# Patient Record
Sex: Female | Born: 1960 | Hispanic: No | Marital: Married
Health system: Southern US, Community
[De-identification: ages and names within clinical notes are randomized; demographics above are authoritative.]

---

## 1999-01-18 ENCOUNTER — Encounter: Payer: Self-pay | Admitting: Emergency Medicine

## 1999-01-18 ENCOUNTER — Emergency Department (HOSPITAL_COMMUNITY): Admission: EM | Admit: 1999-01-18 | Discharge: 1999-01-18 | Payer: Self-pay | Admitting: Emergency Medicine

## 1999-07-26 ENCOUNTER — Encounter: Payer: Self-pay | Admitting: Emergency Medicine

## 1999-07-26 ENCOUNTER — Emergency Department (HOSPITAL_COMMUNITY): Admission: EM | Admit: 1999-07-26 | Discharge: 1999-07-26 | Payer: Self-pay | Admitting: Emergency Medicine

## 2001-04-24 ENCOUNTER — Encounter: Admission: RE | Admit: 2001-04-24 | Discharge: 2001-04-24 | Payer: Self-pay | Admitting: Family Medicine

## 2001-05-05 ENCOUNTER — Encounter (INDEPENDENT_AMBULATORY_CARE_PROVIDER_SITE_OTHER): Payer: Self-pay | Admitting: *Deleted

## 2001-05-05 LAB — CONVERTED CEMR LAB

## 2001-05-24 ENCOUNTER — Encounter: Admission: RE | Admit: 2001-05-24 | Discharge: 2001-05-24 | Payer: Self-pay | Admitting: Family Medicine

## 2001-05-24 ENCOUNTER — Other Ambulatory Visit: Admission: RE | Admit: 2001-05-24 | Discharge: 2001-05-24 | Payer: Self-pay | Admitting: Family Medicine

## 2001-05-25 ENCOUNTER — Encounter: Admission: RE | Admit: 2001-05-25 | Discharge: 2001-08-23 | Payer: Self-pay | Admitting: Family Medicine

## 2001-06-07 ENCOUNTER — Ambulatory Visit (HOSPITAL_COMMUNITY): Admission: RE | Admit: 2001-06-07 | Discharge: 2001-06-07 | Payer: Self-pay | Admitting: Family Medicine

## 2001-06-08 ENCOUNTER — Encounter: Payer: Self-pay | Admitting: *Deleted

## 2001-06-08 ENCOUNTER — Ambulatory Visit (HOSPITAL_COMMUNITY): Admission: RE | Admit: 2001-06-08 | Discharge: 2001-06-08 | Payer: Self-pay | Admitting: *Deleted

## 2001-06-09 ENCOUNTER — Encounter (INDEPENDENT_AMBULATORY_CARE_PROVIDER_SITE_OTHER): Payer: Self-pay

## 2001-06-09 ENCOUNTER — Ambulatory Visit (HOSPITAL_COMMUNITY): Admission: RE | Admit: 2001-06-09 | Discharge: 2001-06-09 | Payer: Self-pay | Admitting: Obstetrics and Gynecology

## 2001-06-14 ENCOUNTER — Encounter: Admission: RE | Admit: 2001-06-14 | Discharge: 2001-06-14 | Payer: Self-pay | Admitting: Family Medicine

## 2001-09-21 ENCOUNTER — Encounter: Admission: RE | Admit: 2001-09-21 | Discharge: 2001-09-21 | Payer: Self-pay | Admitting: Family Medicine

## 2001-09-24 ENCOUNTER — Ambulatory Visit (HOSPITAL_COMMUNITY): Admission: RE | Admit: 2001-09-24 | Discharge: 2001-09-24 | Payer: Self-pay | Admitting: Family Medicine

## 2002-05-26 ENCOUNTER — Inpatient Hospital Stay (HOSPITAL_COMMUNITY): Admission: AD | Admit: 2002-05-26 | Discharge: 2002-05-26 | Payer: Self-pay | Admitting: *Deleted

## 2002-06-26 ENCOUNTER — Encounter: Admission: RE | Admit: 2002-06-26 | Discharge: 2002-06-26 | Payer: Self-pay | Admitting: *Deleted

## 2002-07-02 ENCOUNTER — Encounter: Admission: RE | Admit: 2002-07-02 | Discharge: 2002-09-30 | Payer: Self-pay | Admitting: *Deleted

## 2002-07-04 ENCOUNTER — Encounter: Admission: RE | Admit: 2002-07-04 | Discharge: 2002-07-04 | Payer: Self-pay | Admitting: *Deleted

## 2002-07-08 ENCOUNTER — Encounter: Admission: RE | Admit: 2002-07-08 | Discharge: 2002-07-08 | Payer: Self-pay | Admitting: *Deleted

## 2002-07-11 ENCOUNTER — Encounter: Admission: RE | Admit: 2002-07-11 | Discharge: 2002-07-11 | Payer: Self-pay | Admitting: Family Medicine

## 2002-07-18 ENCOUNTER — Encounter: Admission: RE | Admit: 2002-07-18 | Discharge: 2002-07-18 | Payer: Self-pay | Admitting: *Deleted

## 2002-08-15 ENCOUNTER — Encounter: Admission: RE | Admit: 2002-08-15 | Discharge: 2002-08-15 | Payer: Self-pay | Admitting: *Deleted

## 2002-09-05 ENCOUNTER — Encounter: Admission: RE | Admit: 2002-09-05 | Discharge: 2002-09-05 | Payer: Self-pay | Admitting: Family Medicine

## 2002-09-09 ENCOUNTER — Emergency Department (HOSPITAL_COMMUNITY): Admission: EM | Admit: 2002-09-09 | Discharge: 2002-09-09 | Payer: Self-pay | Admitting: Emergency Medicine

## 2002-09-10 ENCOUNTER — Ambulatory Visit (HOSPITAL_COMMUNITY): Admission: RE | Admit: 2002-09-10 | Discharge: 2002-09-10 | Payer: Self-pay | Admitting: Family Medicine

## 2002-09-25 ENCOUNTER — Encounter: Admission: RE | Admit: 2002-09-25 | Discharge: 2002-09-25 | Payer: Self-pay | Admitting: *Deleted

## 2002-10-09 ENCOUNTER — Encounter: Admission: RE | Admit: 2002-10-09 | Discharge: 2002-10-09 | Payer: Self-pay | Admitting: *Deleted

## 2002-10-23 ENCOUNTER — Encounter: Admission: RE | Admit: 2002-10-23 | Discharge: 2002-10-23 | Payer: Self-pay | Admitting: *Deleted

## 2002-11-06 ENCOUNTER — Ambulatory Visit (HOSPITAL_COMMUNITY): Admission: RE | Admit: 2002-11-06 | Discharge: 2002-11-06 | Payer: Self-pay | Admitting: *Deleted

## 2002-11-07 ENCOUNTER — Encounter: Admission: RE | Admit: 2002-11-07 | Discharge: 2002-11-07 | Payer: Self-pay | Admitting: *Deleted

## 2002-11-14 ENCOUNTER — Encounter: Admission: RE | Admit: 2002-11-14 | Discharge: 2002-11-14 | Payer: Self-pay | Admitting: *Deleted

## 2002-11-18 ENCOUNTER — Encounter: Admission: RE | Admit: 2002-11-18 | Discharge: 2002-11-18 | Payer: Self-pay | Admitting: *Deleted

## 2002-11-21 ENCOUNTER — Encounter: Admission: RE | Admit: 2002-11-21 | Discharge: 2002-11-21 | Payer: Self-pay | Admitting: Family Medicine

## 2002-11-28 ENCOUNTER — Encounter: Admission: RE | Admit: 2002-11-28 | Discharge: 2002-11-28 | Payer: Self-pay | Admitting: *Deleted

## 2002-12-02 ENCOUNTER — Encounter: Admission: RE | Admit: 2002-12-02 | Discharge: 2002-12-02 | Payer: Self-pay | Admitting: *Deleted

## 2002-12-10 ENCOUNTER — Encounter: Admission: RE | Admit: 2002-12-10 | Discharge: 2002-12-10 | Payer: Self-pay | Admitting: *Deleted

## 2002-12-12 ENCOUNTER — Encounter: Admission: RE | Admit: 2002-12-12 | Discharge: 2002-12-12 | Payer: Self-pay | Admitting: Family Medicine

## 2002-12-16 ENCOUNTER — Inpatient Hospital Stay (HOSPITAL_COMMUNITY): Admission: AD | Admit: 2002-12-16 | Discharge: 2002-12-16 | Payer: Self-pay | Admitting: Obstetrics & Gynecology

## 2002-12-17 ENCOUNTER — Inpatient Hospital Stay (HOSPITAL_COMMUNITY): Admission: AD | Admit: 2002-12-17 | Discharge: 2002-12-17 | Payer: Self-pay | Admitting: *Deleted

## 2002-12-23 ENCOUNTER — Ambulatory Visit (HOSPITAL_COMMUNITY): Admission: RE | Admit: 2002-12-23 | Discharge: 2002-12-23 | Payer: Self-pay | Admitting: Family Medicine

## 2002-12-23 ENCOUNTER — Encounter: Admission: RE | Admit: 2002-12-23 | Discharge: 2002-12-23 | Payer: Self-pay | Admitting: *Deleted

## 2002-12-24 ENCOUNTER — Inpatient Hospital Stay (HOSPITAL_COMMUNITY): Admission: AD | Admit: 2002-12-24 | Discharge: 2002-12-24 | Payer: Self-pay | Admitting: Obstetrics and Gynecology

## 2002-12-24 ENCOUNTER — Encounter (INDEPENDENT_AMBULATORY_CARE_PROVIDER_SITE_OTHER): Payer: Self-pay | Admitting: Specialist

## 2002-12-24 ENCOUNTER — Inpatient Hospital Stay (HOSPITAL_COMMUNITY): Admission: AD | Admit: 2002-12-24 | Discharge: 2002-12-27 | Payer: Self-pay | Admitting: Obstetrics and Gynecology

## 2003-01-01 ENCOUNTER — Inpatient Hospital Stay (HOSPITAL_COMMUNITY): Admission: AD | Admit: 2003-01-01 | Discharge: 2003-01-01 | Payer: Self-pay | Admitting: Obstetrics and Gynecology

## 2003-01-03 ENCOUNTER — Inpatient Hospital Stay (HOSPITAL_COMMUNITY): Admission: AD | Admit: 2003-01-03 | Discharge: 2003-01-03 | Payer: Self-pay | Admitting: Family Medicine

## 2003-01-08 ENCOUNTER — Inpatient Hospital Stay (HOSPITAL_COMMUNITY): Admission: AD | Admit: 2003-01-08 | Discharge: 2003-01-08 | Payer: Self-pay | Admitting: Obstetrics and Gynecology

## 2003-01-15 ENCOUNTER — Inpatient Hospital Stay (HOSPITAL_COMMUNITY): Admission: AD | Admit: 2003-01-15 | Discharge: 2003-01-15 | Payer: Self-pay | Admitting: Obstetrics and Gynecology

## 2003-01-27 ENCOUNTER — Encounter: Admission: RE | Admit: 2003-01-27 | Discharge: 2003-01-27 | Payer: Self-pay | Admitting: Family Medicine

## 2003-01-29 ENCOUNTER — Encounter: Admission: RE | Admit: 2003-01-29 | Discharge: 2003-01-29 | Payer: Self-pay | Admitting: Family Medicine

## 2003-02-04 ENCOUNTER — Emergency Department (HOSPITAL_COMMUNITY): Admission: EM | Admit: 2003-02-04 | Discharge: 2003-02-04 | Payer: Self-pay | Admitting: Emergency Medicine

## 2003-02-12 ENCOUNTER — Encounter: Admission: RE | Admit: 2003-02-12 | Discharge: 2003-02-12 | Payer: Self-pay | Admitting: Family Medicine

## 2003-07-09 ENCOUNTER — Encounter: Admission: RE | Admit: 2003-07-09 | Discharge: 2003-07-09 | Payer: Self-pay | Admitting: Specialist

## 2004-01-09 ENCOUNTER — Ambulatory Visit: Payer: Self-pay | Admitting: *Deleted

## 2004-01-09 ENCOUNTER — Ambulatory Visit: Payer: Self-pay | Admitting: Internal Medicine

## 2004-03-08 ENCOUNTER — Ambulatory Visit: Payer: Self-pay | Admitting: Internal Medicine

## 2004-03-24 ENCOUNTER — Emergency Department (HOSPITAL_COMMUNITY): Admission: EM | Admit: 2004-03-24 | Discharge: 2004-03-24 | Payer: Self-pay | Admitting: Emergency Medicine

## 2004-07-05 ENCOUNTER — Ambulatory Visit: Payer: Self-pay | Admitting: Internal Medicine

## 2004-07-27 ENCOUNTER — Ambulatory Visit: Payer: Self-pay | Admitting: Internal Medicine

## 2004-08-03 ENCOUNTER — Ambulatory Visit: Payer: Self-pay | Admitting: Internal Medicine

## 2004-10-14 IMAGING — CT CT ABDOMEN W/O CM
1 series · 15 of 32 positions shown, 19 images · non-contrast
Comparison: none

CLINICAL DATA: Abdominal and pelvic pain.  Left flank pain. 
CT SCAN OF THE ABDOMEN AND PELVIS WITHOUT CONTRAST
TECHNIQUE: Multidetector helical CT scanning obtained from the upper abdomen through the remainder of the abdomen and pelvis.

[Series 2: renal stone · axial · 0.70mm/px · z∈[-405,-80]mm · 15 of 73 slices shown, 19 images]
[im 5/73  soft-tissue]
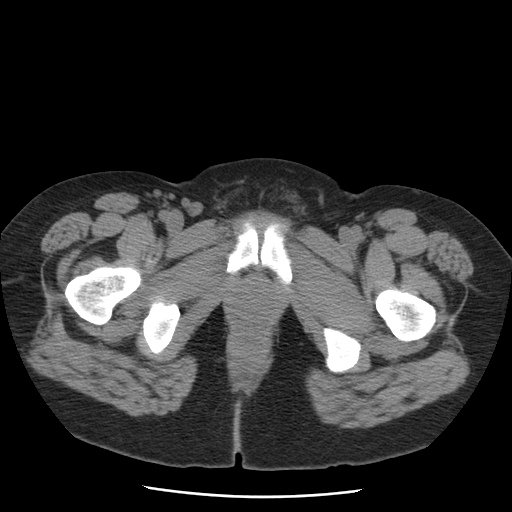
[im 5/73  bone]
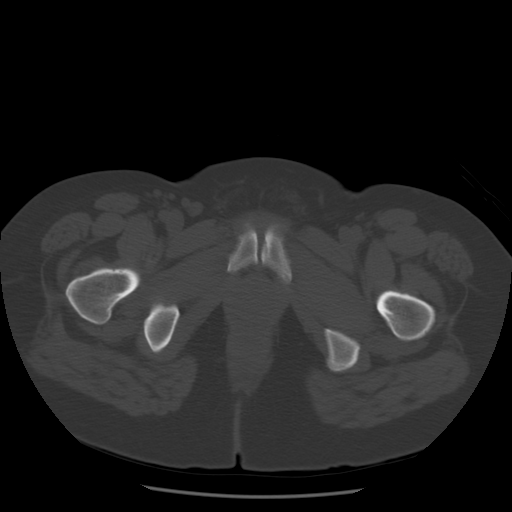
[im 10/73  soft-tissue]
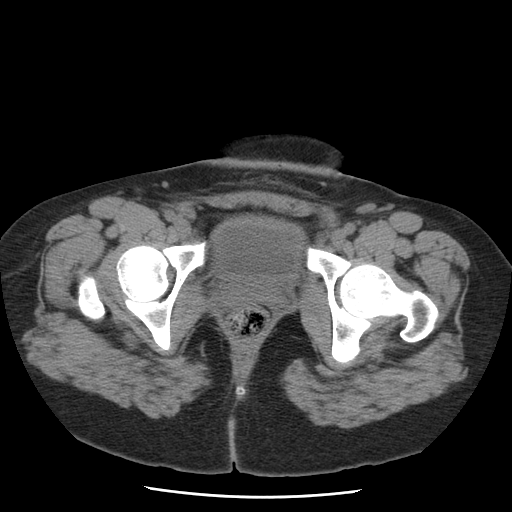
[im 14/73  soft-tissue]
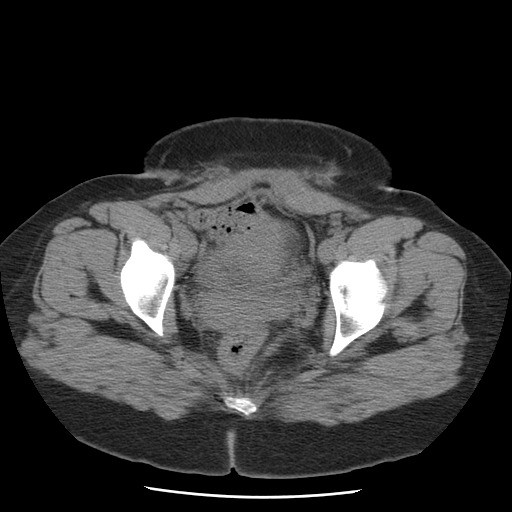
[im 21/73  soft-tissue]
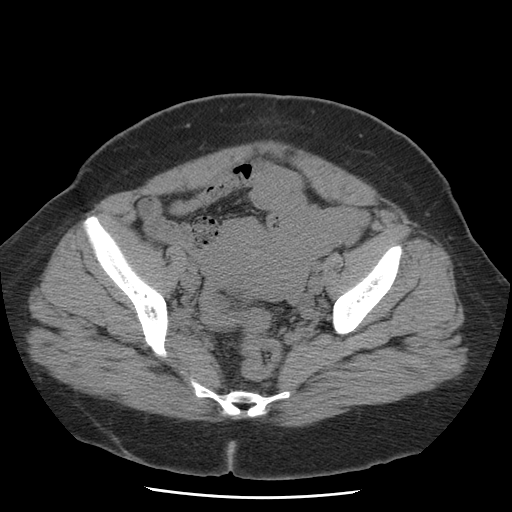
[im 26/73  soft-tissue]
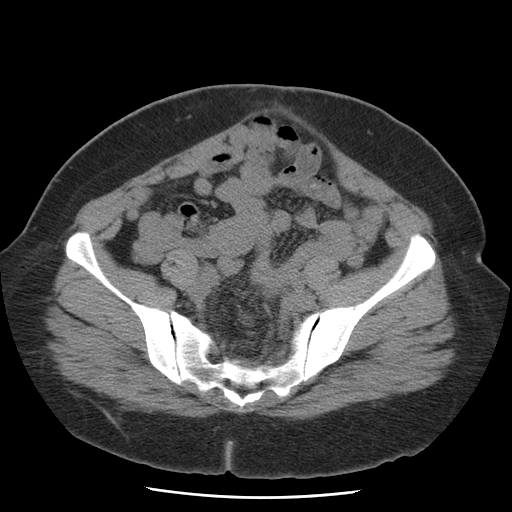
[im 31/73  soft-tissue]
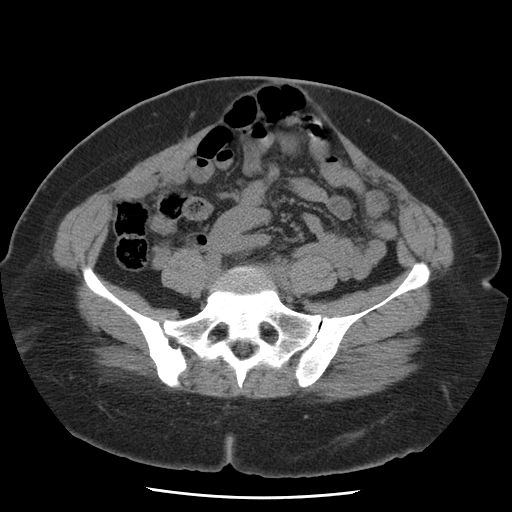
[im 38/73  soft-tissue]
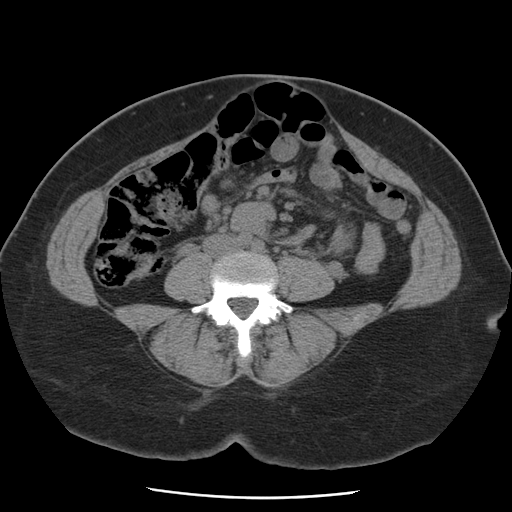
[im 42/73  soft-tissue]
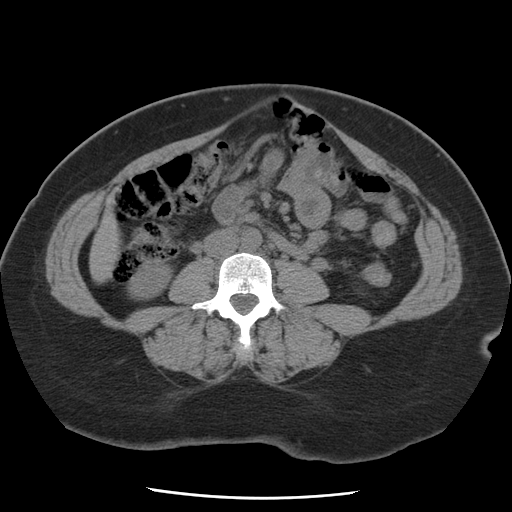
[im 47/73  soft-tissue]
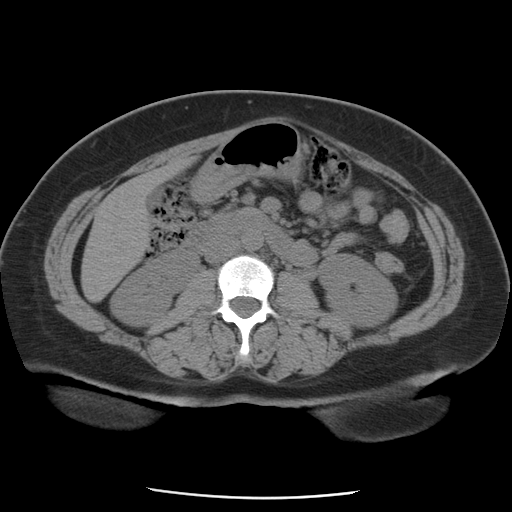
[im 47/73  bone]
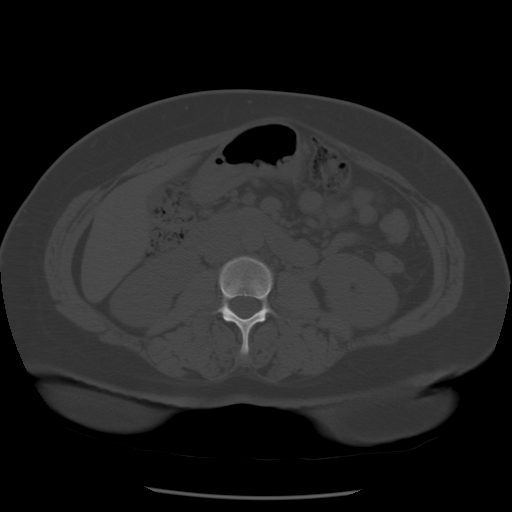
[im 52/73  soft-tissue]
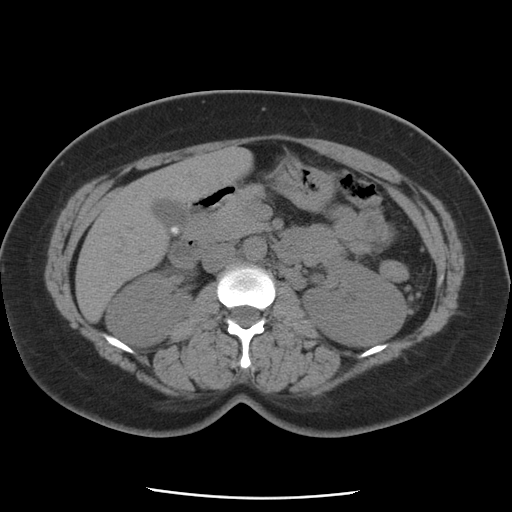
[im 59/73  soft-tissue]
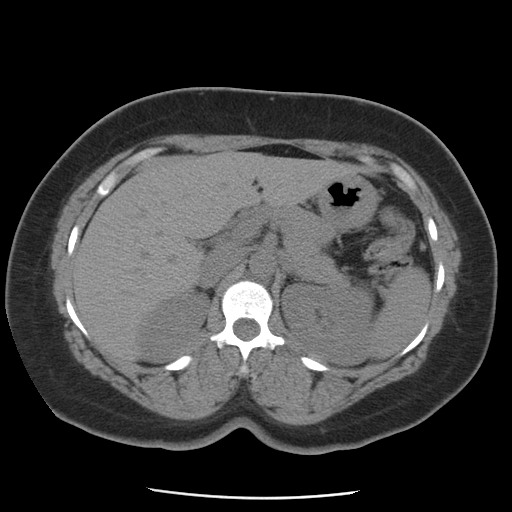
[im 63/73  soft-tissue]
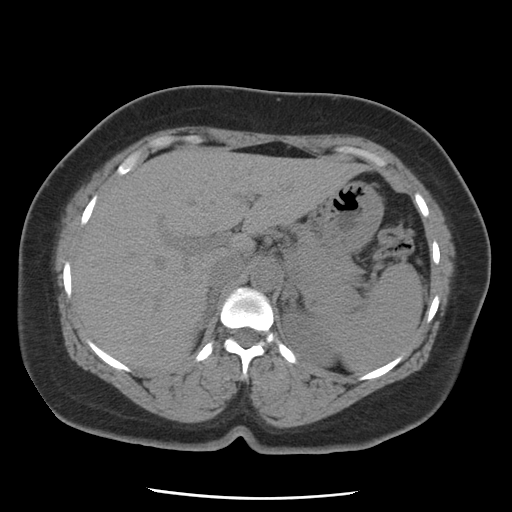
[im 63/73  lung]
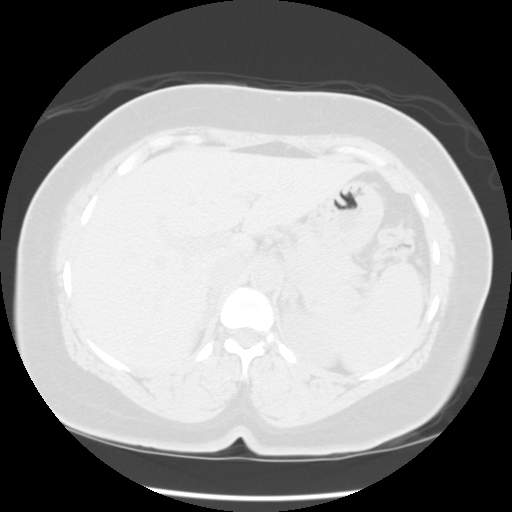
[im 66/73  lung]
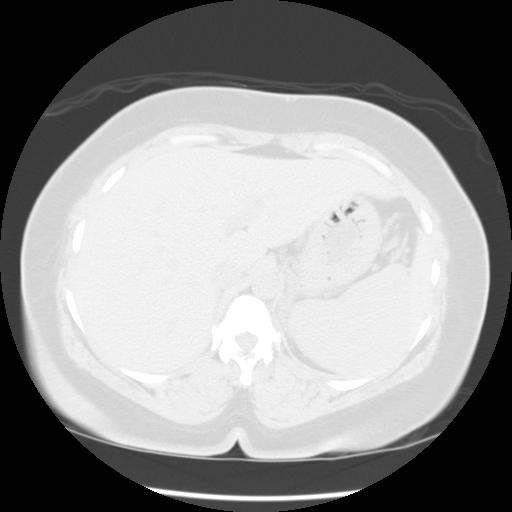
[im 68/73  soft-tissue]
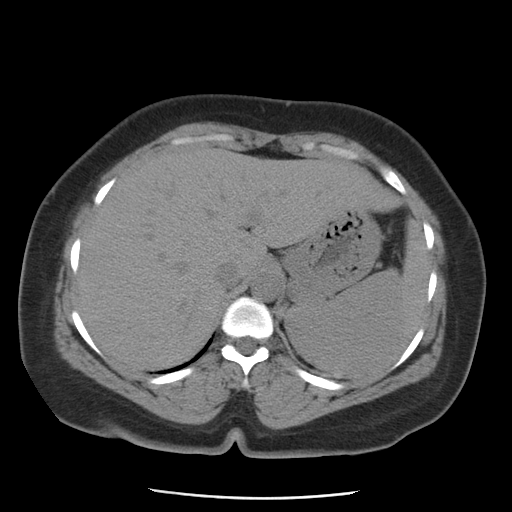
[im 68/73  lung]
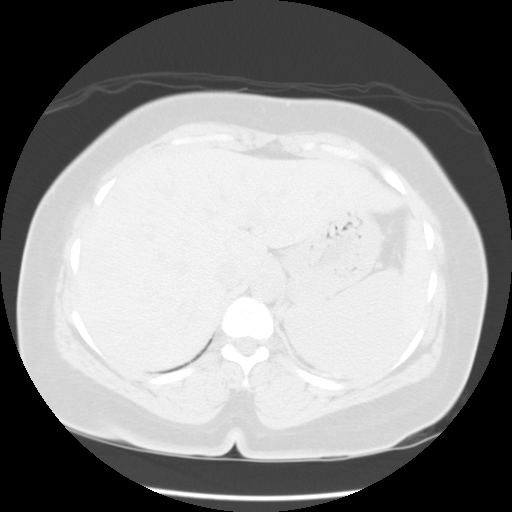
[im 70/73  lung]
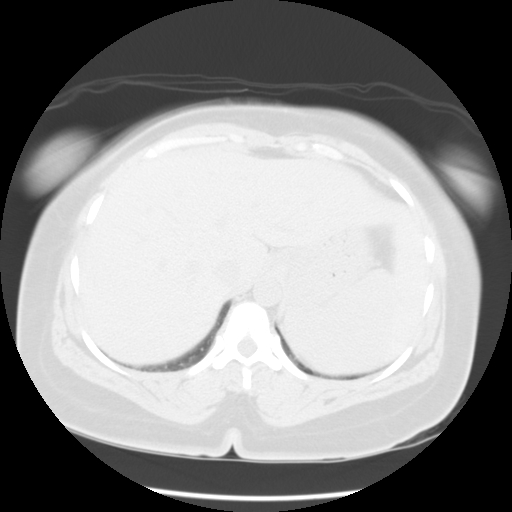

[15 of 32 positions shown; findings below may reference images not displayed]

FINDINGS: The visualized liver and spleen are unremarkable.  The adrenal glands, pancreas, and kidneys are unremarkable.  No evidence of urinary calculi or hydronephrosis.  Please note that parenchymal lesions within the solid viscera may be missed as intravenous contrast was not administered.  6 mm gallstone within the gallbladder is noted, with the remainder of the gallbladder unremarkable.  No evidence of enlarged lymph nodes or free fluid.  No abdominal aortic aneurysm.  Please note that parenchymal lesions within the solid viscera may be missed as intravenous contrast was not administered.
IMPRESSION
No acute abnormality
Cholelithiasis

CT SCAN OF THE PELVIS
Mild eventration of the anterior abdominal musculature at the midline is noted.  No free fluid, enlarged lymph nodes, or masses.  There is mild stranding in the midline anterior abdominal musculature compatible with recent C-section.  The visualized bowel is grossly unremarkable.  No evidence of urinary calculi. 
IMPRESSION
No acute abnormality.  Evidence of recent C-section.

## 2005-02-16 ENCOUNTER — Ambulatory Visit: Payer: Self-pay | Admitting: Family Medicine

## 2005-03-25 ENCOUNTER — Ambulatory Visit: Payer: Self-pay | Admitting: Internal Medicine

## 2005-04-25 ENCOUNTER — Ambulatory Visit: Payer: Self-pay | Admitting: Family Medicine

## 2005-05-23 ENCOUNTER — Ambulatory Visit: Payer: Self-pay | Admitting: Family Medicine

## 2005-06-14 ENCOUNTER — Ambulatory Visit: Payer: Self-pay | Admitting: Family Medicine

## 2005-08-04 ENCOUNTER — Ambulatory Visit: Payer: Self-pay | Admitting: Internal Medicine

## 2005-09-20 ENCOUNTER — Ambulatory Visit: Payer: Self-pay | Admitting: Family Medicine
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
## 2005-12-26 ENCOUNTER — Ambulatory Visit: Payer: Self-pay | Admitting: Internal Medicine

## 2005-12-27 LAB — CONVERTED CEMR LAB
Cholesterol: 132 mg/dL
HDL: 36 mg/dL
LDL Cholesterol: 81 mg/dL
Total CHOL/HDL Ratio: 3.7
Triglycerides: 76 mg/dL
VLDL: 15 mg/dL

## 2006-03-29 ENCOUNTER — Ambulatory Visit: Payer: Self-pay | Admitting: Internal Medicine

## 2006-04-20 ENCOUNTER — Emergency Department (HOSPITAL_COMMUNITY): Admission: EM | Admit: 2006-04-20 | Discharge: 2006-04-20 | Payer: Self-pay | Admitting: Pediatrics

## 2006-06-01 DIAGNOSIS — M654 Radial styloid tenosynovitis [de Quervain]: Secondary | ICD-10-CM

## 2006-06-01 DIAGNOSIS — E119 Type 2 diabetes mellitus without complications: Secondary | ICD-10-CM | POA: Insufficient documentation

## 2006-06-02 ENCOUNTER — Encounter (INDEPENDENT_AMBULATORY_CARE_PROVIDER_SITE_OTHER): Payer: Self-pay | Admitting: *Deleted

## 2006-07-03 ENCOUNTER — Ambulatory Visit: Payer: Self-pay | Admitting: Internal Medicine

## 2006-09-08 ENCOUNTER — Ambulatory Visit: Payer: Self-pay | Admitting: Internal Medicine

## 2006-09-19 ENCOUNTER — Ambulatory Visit: Payer: Self-pay | Admitting: Family Medicine

## 2006-09-21 ENCOUNTER — Encounter: Admission: RE | Admit: 2006-09-21 | Discharge: 2006-12-20 | Payer: Self-pay | Admitting: Internal Medicine

## 2006-09-21 ENCOUNTER — Ambulatory Visit (HOSPITAL_COMMUNITY): Admission: RE | Admit: 2006-09-21 | Discharge: 2006-09-21 | Payer: Self-pay | Admitting: Internal Medicine

## 2006-11-07 ENCOUNTER — Ambulatory Visit: Payer: Self-pay | Admitting: Internal Medicine

## 2006-12-20 ENCOUNTER — Encounter (INDEPENDENT_AMBULATORY_CARE_PROVIDER_SITE_OTHER): Payer: Self-pay | Admitting: *Deleted

## 2007-01-04 ENCOUNTER — Encounter (INDEPENDENT_AMBULATORY_CARE_PROVIDER_SITE_OTHER): Payer: Self-pay | Admitting: Internal Medicine

## 2007-01-16 ENCOUNTER — Ambulatory Visit: Payer: Self-pay | Admitting: Nurse Practitioner

## 2007-01-16 DIAGNOSIS — B379 Candidiasis, unspecified: Secondary | ICD-10-CM | POA: Insufficient documentation

## 2007-01-16 DIAGNOSIS — E785 Hyperlipidemia, unspecified: Secondary | ICD-10-CM

## 2007-01-16 LAB — CONVERTED CEMR LAB
Blood Glucose, Fingerstick: 107
Cholesterol, target level: 200 mg/dL
HDL goal, serum: 40 mg/dL
Hgb A1c MFr Bld: 6.1 %
LDL Goal: 100 mg/dL

## 2007-01-22 ENCOUNTER — Telehealth (INDEPENDENT_AMBULATORY_CARE_PROVIDER_SITE_OTHER): Payer: Self-pay | Admitting: *Deleted

## 2007-01-27 ENCOUNTER — Emergency Department (HOSPITAL_COMMUNITY): Admission: EM | Admit: 2007-01-27 | Discharge: 2007-01-27 | Payer: Self-pay | Admitting: Family Medicine

## 2007-02-01 ENCOUNTER — Encounter (INDEPENDENT_AMBULATORY_CARE_PROVIDER_SITE_OTHER): Payer: Self-pay | Admitting: Nurse Practitioner

## 2007-02-01 ENCOUNTER — Ambulatory Visit: Payer: Self-pay | Admitting: Family Medicine

## 2007-02-01 LAB — CONVERTED CEMR LAB
Blood Glucose, AC Bkfst: 104 mg/dL
Blood Glucose, Fingerstick: 104
Cholesterol: 130 mg/dL
HDL: 41 mg/dL
LDL Cholesterol: 75 mg/dL
Total CHOL/HDL Ratio: 3.2
Triglycerides: 72 mg/dL
VLDL: 14 mg/dL

## 2007-02-02 ENCOUNTER — Encounter (INDEPENDENT_AMBULATORY_CARE_PROVIDER_SITE_OTHER): Payer: Self-pay | Admitting: Nurse Practitioner

## 2007-03-09 ENCOUNTER — Telehealth (INDEPENDENT_AMBULATORY_CARE_PROVIDER_SITE_OTHER): Payer: Self-pay | Admitting: *Deleted

## 2007-03-09 ENCOUNTER — Emergency Department (HOSPITAL_COMMUNITY): Admission: EM | Admit: 2007-03-09 | Discharge: 2007-03-09 | Payer: Self-pay | Admitting: Family Medicine

## 2007-03-21 ENCOUNTER — Ambulatory Visit: Payer: Self-pay | Admitting: Family Medicine

## 2007-03-21 DIAGNOSIS — N644 Mastodynia: Secondary | ICD-10-CM

## 2007-03-21 LAB — CONVERTED CEMR LAB: Blood Glucose, Fingerstick: 134

## 2007-03-22 ENCOUNTER — Ambulatory Visit (HOSPITAL_COMMUNITY): Admission: RE | Admit: 2007-03-22 | Discharge: 2007-03-22 | Payer: Self-pay | Admitting: Family Medicine

## 2007-04-03 ENCOUNTER — Telehealth (INDEPENDENT_AMBULATORY_CARE_PROVIDER_SITE_OTHER): Payer: Self-pay | Admitting: Family Medicine

## 2007-05-29 ENCOUNTER — Ambulatory Visit: Payer: Self-pay | Admitting: Family Medicine

## 2007-05-29 DIAGNOSIS — H6120 Impacted cerumen, unspecified ear: Secondary | ICD-10-CM

## 2007-05-29 DIAGNOSIS — L708 Other acne: Secondary | ICD-10-CM

## 2007-05-29 DIAGNOSIS — K219 Gastro-esophageal reflux disease without esophagitis: Secondary | ICD-10-CM

## 2007-05-29 LAB — CONVERTED CEMR LAB
Blood Glucose, Fingerstick: 114
Hgb A1c MFr Bld: 6.4 %

## 2007-09-15 ENCOUNTER — Emergency Department (HOSPITAL_COMMUNITY): Admission: EM | Admit: 2007-09-15 | Discharge: 2007-09-15 | Payer: Self-pay | Admitting: Emergency Medicine

## 2008-01-30 ENCOUNTER — Emergency Department (HOSPITAL_COMMUNITY): Admission: EM | Admit: 2008-01-30 | Discharge: 2008-01-30 | Payer: Self-pay | Admitting: Family Medicine

## 2008-03-24 ENCOUNTER — Ambulatory Visit (HOSPITAL_COMMUNITY): Admission: RE | Admit: 2008-03-24 | Discharge: 2008-03-24 | Payer: Self-pay | Admitting: Family Medicine

## 2008-06-03 ENCOUNTER — Ambulatory Visit: Payer: Self-pay | Admitting: Family Medicine

## 2008-06-03 DIAGNOSIS — I10 Essential (primary) hypertension: Secondary | ICD-10-CM | POA: Insufficient documentation

## 2008-06-03 LAB — CONVERTED CEMR LAB
Bilirubin Urine: NEGATIVE
Blood Glucose, Fingerstick: 99
Blood in Urine, dipstick: NEGATIVE
Glucose, Urine, Semiquant: NEGATIVE
Hgb A1c MFr Bld: 6.5 %
Ketones, urine, test strip: NEGATIVE
Microalb, Ur: 0.77 mg/dL (ref 0.00–1.89)
Nitrite: NEGATIVE
Protein, U semiquant: NEGATIVE
Specific Gravity, Urine: >=1.03
Urobilinogen, UA: 1
WBC Urine, dipstick: NEGATIVE
pH: 5.5

## 2008-06-04 ENCOUNTER — Telehealth (INDEPENDENT_AMBULATORY_CARE_PROVIDER_SITE_OTHER): Payer: Self-pay | Admitting: Family Medicine

## 2008-06-26 ENCOUNTER — Ambulatory Visit: Payer: Self-pay | Admitting: Family Medicine

## 2008-06-30 ENCOUNTER — Ambulatory Visit: Payer: Self-pay | Admitting: Family Medicine

## 2008-06-30 LAB — CONVERTED CEMR LAB
ALT: 14 units/L (ref 0–35)
AST: 12 units/L (ref 0–37)
Albumin: 4.2 g/dL (ref 3.5–5.2)
Alkaline Phosphatase: 76 units/L (ref 39–117)
BUN: 10 mg/dL (ref 6–23)
Basophils Absolute: 0 10*3/uL (ref 0.0–0.1)
Basophils Relative: 0 % (ref 0–1)
CO2: 22 meq/L (ref 19–32)
Calcium: 8.8 mg/dL (ref 8.4–10.5)
Chloride: 105 meq/L (ref 96–112)
Cholesterol: 136 mg/dL (ref 0–200)
Creatinine, Ser: 0.5 mg/dL (ref 0.40–1.20)
Eosinophils Absolute: 0.1 10*3/uL (ref 0.0–0.7)
Eosinophils Relative: 2 % (ref 0–5)
Glucose, Bld: 111 mg/dL — ABNORMAL HIGH (ref 70–99)
HCT: 35.4 % — ABNORMAL LOW (ref 36.0–46.0)
HDL: 37 mg/dL — ABNORMAL LOW (ref >39)
Hemoglobin: 12 g/dL (ref 12.0–15.0)
LDL Cholesterol: 82 mg/dL (ref 0–99)
Lymphocytes Relative: 42 % (ref 12–46)
Lymphs Abs: 2.1 10*3/uL (ref 0.7–4.0)
MCHC: 33.9 g/dL (ref 30.0–36.0)
MCV: 87 fL (ref 78.0–100.0)
Monocytes Absolute: 0.4 10*3/uL (ref 0.1–1.0)
Monocytes Relative: 8 % (ref 3–12)
Neutro Abs: 2.3 10*3/uL (ref 1.7–7.7)
Neutrophils Relative %: 47 % (ref 43–77)
Platelets: 241 10*3/uL (ref 150–400)
Potassium: 4.2 meq/L (ref 3.5–5.3)
RBC: 4.07 M/uL (ref 3.87–5.11)
RDW: 13.2 % (ref 11.5–15.5)
Sodium: 139 meq/L (ref 135–145)
TSH: 0.522 microintl units/mL (ref 0.350–4.500)
Total Bilirubin: 0.3 mg/dL (ref 0.3–1.2)
Total CHOL/HDL Ratio: 3.7
Total Protein: 7.1 g/dL (ref 6.0–8.3)
Triglycerides: 83 mg/dL (ref <150)
VLDL: 17 mg/dL (ref 0–40)
WBC: 4.9 10*3/uL (ref 4.0–10.5)

## 2008-07-10 ENCOUNTER — Ambulatory Visit: Payer: Self-pay | Admitting: Internal Medicine

## 2008-07-10 ENCOUNTER — Encounter: Payer: Self-pay | Admitting: Internal Medicine

## 2008-07-10 DIAGNOSIS — R42 Dizziness and giddiness: Secondary | ICD-10-CM

## 2008-07-10 DIAGNOSIS — J019 Acute sinusitis, unspecified: Secondary | ICD-10-CM

## 2008-07-10 LAB — CONVERTED CEMR LAB: Blood Glucose, Fingerstick: 166

## 2008-07-18 ENCOUNTER — Ambulatory Visit: Payer: Self-pay | Admitting: Nurse Practitioner

## 2009-03-06 ENCOUNTER — Telehealth: Payer: Self-pay | Admitting: Physician Assistant

## 2009-03-16 ENCOUNTER — Telehealth: Payer: Self-pay | Admitting: Physician Assistant

## 2009-03-25 ENCOUNTER — Encounter: Payer: Self-pay | Admitting: Physician Assistant

## 2009-03-25 ENCOUNTER — Ambulatory Visit (HOSPITAL_COMMUNITY): Admission: RE | Admit: 2009-03-25 | Discharge: 2009-03-25 | Payer: Self-pay | Admitting: Family Medicine

## 2009-04-01 ENCOUNTER — Encounter: Payer: Self-pay | Admitting: Physician Assistant

## 2009-04-01 ENCOUNTER — Ambulatory Visit: Payer: Self-pay | Admitting: Nurse Practitioner

## 2009-04-01 DIAGNOSIS — H547 Unspecified visual loss: Secondary | ICD-10-CM

## 2009-04-01 DIAGNOSIS — K5900 Constipation, unspecified: Secondary | ICD-10-CM | POA: Insufficient documentation

## 2009-04-01 LAB — CONVERTED CEMR LAB
Blood Glucose, Fingerstick: 109
Chlamydia, DNA Probe: NEGATIVE
GC Probe Amp, Genital: NEGATIVE
OCCULT 1: NEGATIVE

## 2009-04-14 ENCOUNTER — Encounter (INDEPENDENT_AMBULATORY_CARE_PROVIDER_SITE_OTHER): Payer: Self-pay | Admitting: Nurse Practitioner

## 2009-04-15 ENCOUNTER — Encounter: Payer: Self-pay | Admitting: Physician Assistant

## 2009-04-21 ENCOUNTER — Ambulatory Visit: Payer: Self-pay | Admitting: Nurse Practitioner

## 2009-04-21 LAB — CONVERTED CEMR LAB
ALT: 16 units/L (ref 0–35)
AST: 11 units/L (ref 0–37)
Albumin: 4.2 g/dL (ref 3.5–5.2)
Alkaline Phosphatase: 82 units/L (ref 39–117)
BUN: 8 mg/dL (ref 6–23)
Basophils Absolute: 0 10*3/uL (ref 0.0–0.1)
Basophils Relative: 0 % (ref 0–1)
CO2: 21 meq/L (ref 19–32)
Calcium: 9 mg/dL (ref 8.4–10.5)
Chloride: 104 meq/L (ref 96–112)
Cholesterol: 164 mg/dL (ref 0–200)
Creatinine, Ser: 0.45 mg/dL (ref 0.40–1.20)
Eosinophils Absolute: 0.1 10*3/uL (ref 0.0–0.7)
Eosinophils Relative: 2 % (ref 0–5)
Glucose, Bld: 137 mg/dL — ABNORMAL HIGH (ref 70–99)
HCT: 36.6 % (ref 36.0–46.0)
HDL: 45 mg/dL (ref >39)
Hemoglobin: 11.8 g/dL — ABNORMAL LOW (ref 12.0–15.0)
LDL Cholesterol: 105 mg/dL — ABNORMAL HIGH (ref 0–99)
Lymphocytes Relative: 40 % (ref 12–46)
Lymphs Abs: 2.1 10*3/uL (ref 0.7–4.0)
MCHC: 32.2 g/dL (ref 30.0–36.0)
MCV: 89.7 fL (ref 78.0–100.0)
Monocytes Absolute: 0.4 10*3/uL (ref 0.1–1.0)
Monocytes Relative: 8 % (ref 3–12)
Neutro Abs: 2.7 10*3/uL (ref 1.7–7.7)
Neutrophils Relative %: 50 % (ref 43–77)
Platelets: 260 10*3/uL (ref 150–400)
Potassium: 3.8 meq/L (ref 3.5–5.3)
RBC: 4.08 M/uL (ref 3.87–5.11)
RDW: 13.2 % (ref 11.5–15.5)
Sodium: 136 meq/L (ref 135–145)
TSH: 0.471 microintl units/mL (ref 0.350–4.500)
Total Bilirubin: 0.3 mg/dL (ref 0.3–1.2)
Total CHOL/HDL Ratio: 3.6
Total Protein: 7 g/dL (ref 6.0–8.3)
Triglycerides: 69 mg/dL (ref <150)
VLDL: 14 mg/dL (ref 0–40)
WBC: 5.4 10*3/uL (ref 4.0–10.5)

## 2009-04-22 ENCOUNTER — Encounter (INDEPENDENT_AMBULATORY_CARE_PROVIDER_SITE_OTHER): Payer: Self-pay | Admitting: Nurse Practitioner

## 2009-04-29 ENCOUNTER — Ambulatory Visit: Payer: Self-pay | Admitting: Nurse Practitioner

## 2009-04-29 DIAGNOSIS — R209 Unspecified disturbances of skin sensation: Secondary | ICD-10-CM

## 2009-04-29 LAB — CONVERTED CEMR LAB: Blood Glucose, Fingerstick: 131

## 2009-06-26 ENCOUNTER — Ambulatory Visit: Payer: Self-pay | Admitting: Nurse Practitioner

## 2009-07-19 ENCOUNTER — Emergency Department (HOSPITAL_COMMUNITY): Admission: EM | Admit: 2009-07-19 | Discharge: 2009-07-19 | Payer: Self-pay | Admitting: Emergency Medicine

## 2009-09-01 ENCOUNTER — Telehealth (INDEPENDENT_AMBULATORY_CARE_PROVIDER_SITE_OTHER): Payer: Self-pay | Admitting: Nurse Practitioner

## 2010-01-07 ENCOUNTER — Emergency Department (HOSPITAL_COMMUNITY): Admission: EM | Admit: 2010-01-07 | Discharge: 2010-01-07 | Payer: Self-pay | Admitting: Emergency Medicine

## 2010-01-15 ENCOUNTER — Ambulatory Visit: Payer: Self-pay | Admitting: Nurse Practitioner

## 2010-01-15 DIAGNOSIS — E669 Obesity, unspecified: Secondary | ICD-10-CM

## 2010-01-15 LAB — CONVERTED CEMR LAB
Basophils Absolute: 0 10*3/uL (ref 0.0–0.1)
Basophils Relative: 0 % (ref 0–1)
Blood Glucose, Fingerstick: 155
Eosinophils Absolute: 0.2 10*3/uL (ref 0.0–0.7)
Eosinophils Relative: 3 % (ref 0–5)
HCT: 36.3 % (ref 36.0–46.0)
Hemoglobin: 11.7 g/dL — ABNORMAL LOW (ref 12.0–15.0)
Hgb A1c MFr Bld: 6.8 % — ABNORMAL HIGH (ref <5.7)
Lymphocytes Relative: 31 % (ref 12–46)
Lymphs Abs: 2.4 10*3/uL (ref 0.7–4.0)
MCHC: 32.2 g/dL (ref 30.0–36.0)
MCV: 88.8 fL (ref 78.0–100.0)
Monocytes Absolute: 0.5 10*3/uL (ref 0.1–1.0)
Monocytes Relative: 7 % (ref 3–12)
Neutro Abs: 4.6 10*3/uL (ref 1.7–7.7)
Neutrophils Relative %: 59 % (ref 43–77)
Platelets: 297 10*3/uL (ref 150–400)
RBC: 4.09 M/uL (ref 3.87–5.11)
RDW: 13.4 % (ref 11.5–15.5)
Rapid Strep: NEGATIVE
WBC: 7.7 10*3/uL (ref 4.0–10.5)

## 2010-01-19 ENCOUNTER — Encounter (INDEPENDENT_AMBULATORY_CARE_PROVIDER_SITE_OTHER): Payer: Self-pay | Admitting: Nurse Practitioner

## 2010-03-10 ENCOUNTER — Telehealth (INDEPENDENT_AMBULATORY_CARE_PROVIDER_SITE_OTHER): Payer: Self-pay | Admitting: Nurse Practitioner

## 2010-03-17 ENCOUNTER — Telehealth (INDEPENDENT_AMBULATORY_CARE_PROVIDER_SITE_OTHER): Payer: Self-pay | Admitting: Nurse Practitioner

## 2010-03-18 ENCOUNTER — Ambulatory Visit (HOSPITAL_COMMUNITY)
Admission: RE | Admit: 2010-03-18 | Discharge: 2010-03-18 | Payer: Self-pay | Source: Home / Self Care | Attending: Internal Medicine | Admitting: Internal Medicine

## 2010-04-12 ENCOUNTER — Ambulatory Visit: Admit: 2010-04-12 | Payer: Self-pay | Admitting: Nurse Practitioner

## 2010-05-02 LAB — CONVERTED CEMR LAB

## 2010-05-06 NOTE — Progress Notes (Signed)
Summary: REFILLS/ GOING HOME FOR THE SUMMER   Phone Note Call from Patient Call back at Home Phone [phone] Call back at [phone] CELL   Reason for Call: Refill Medication Summary of Call: MARTIN PT. MS [place] SAYS THAT SHE IS LEAVING NEXT FRIDAY JUNE 10th AND RETURNING ON SEPTEMBER 9th, AND SHE NEEDS HER MEDICINE, GLUCOTROL AND LISINOPRIL TO TAKE WITH HER, AND THE PHARMACY TOLD HER THAT SHE COULDN'T GET THAT MANY REFILLS ON IT. Initial call taken by: [name],  Sep 01, 2009 4:21 PM  Follow-up for Phone Call        forward to N.Daphine Deutscher, FNP Follow-up by: Levon Hedger,  Sep 01, 2009 4:27 PM  Additional Follow-up for Phone Call Additional follow up Details #1::        Rx printed and in basket pt will need to get 90 supply from [facility]. advise pt there may be a price difference [place] pharmacy will only given 30 days at a time Additional Follow-up by: Lehman Prom FNP,  September 02, 2009 8:36 AM    Additional Follow-up for Phone Call Additional follow up Details #2::    called [phone] left message on machine for pt to return call to the office. Levon Hedger  September 03, 2009 10:50 AM   Additional Follow-up for Phone Call Additional follow up Details #3:: Details for Additional Follow-up Action Taken: Spoke with pt. faxed scripts to [facility] [address] Gaylyn Cheers RN  September 04, 2009 3:11 PM   Prescriptions: LISINOPRIL 5 MG TABS (LISINOPRIL) Take 1 tablet by mouth every morning for blood pressure and kidney protection  #90 x 1   Entered and Authorized by:   Lehman Prom FNP   Signed by:   Lehman Prom FNP on 09/02/2009   Method used:   Print then Give to Patient   RxID:   [id number] GLUCOTROL XL 10 MG  TB24 (GLIPIZIDE) Take 1 tablet by mouth every morning  #90 x 1   Entered and Authorized by:   Lehman Prom FNP   Signed by:   Lehman Prom FNP on 09/02/2009   Method used:   Print then Give to Patient   RxID:   [id number] METFORMIN HCL 500  MG  TABS (METFORMIN HCL) One tablet by mouth two times a day  #180 x 1   Entered and Authorized by:   Lehman Prom FNP   Signed by:   Lehman Prom FNP on 09/02/2009   Method used:   Print then Give to Patient   RxID:   [id number]

## 2010-05-06 NOTE — Letter (Signed)
Summary: *HSN Results Follow up  [facility]  [address] [place], [place] [address]   Phone: [phone]  Fax: [phone]      01/19/2010   [name] [address] [place], [place]  [address]   Dear  Ms. [name],                            ____S.Drinkard,FNP   ____D. Gore,FNP       ____B. McPherson,MD   ____V. Rankins,MD    ____E. Mulberry,MD    __X__N. Daphine Deutscher, FNP  ____D. Reche Dixon, MD    ____K. Philipp Deputy, MD    ____Other     This letter is to inform you that your recent test(s):  _______Pap Smear    ___X____Lab Test     _______X-ray    ___X____ is within acceptable limits  _______ requires a medication change  _______ requires a follow-up lab visit  _______ requires a follow-up visit with your provider   Comments: Labs done during recent office visit are normal.       _________________________________________________________ If you have any questions, please contact our office [phone].                    Sincerely,    Lehman Prom FNP [facility]

## 2010-05-06 NOTE — Miscellaneous (Signed)
Summary: Mammogrm Results   Clinical Lists Changes  Observations: Added new observation of MAMMO DUE: 05/2010 (04/14/2009 13:05) Added new observation of MAMMRECACT: Screening mammogram in 1 year.    (04/01/2009 13:06) Added new observation of MAMMOGRAM: No specific mammographic evidence of malignancy.  Assessment: BIRADS 1.  (04/01/2009 13:06)      Mammogram  Procedure date:  04/01/2009  Findings:      No specific mammographic evidence of malignancy.  Assessment: BIRADS 1.   Comments:      Screening mammogram in 1 year.     Procedures Next Due Date:    Mammogram: 05/2010   Mammogram  Procedure date:  04/01/2009  Findings:      No specific mammographic evidence of malignancy.  Assessment: BIRADS 1.   Comments:      Screening mammogram in 1 year.     Procedures Next Due Date:    Mammogram: 05/2010

## 2010-05-06 NOTE — Letter (Signed)
Summary: *HSN Results Follow up  [facility]  [address] [place], [place] [address]   Phone: [phone]  Fax: [phone]      04/15/2009   [facility] [place] [address] [place], [place]  [address]   Dear  Ms. [name],                            ____S.Drinkard,FNP   ____D. Gore,FNP       ____B. McPherson,MD   ____V. Rankins,MD    ____E. Mulberry,MD    ____N. Daphine Deutscher, FNP  ____D. Reche Dixon, MD    ____K. Philipp Deputy, MD    __x__S. Alben Spittle, PA-C     This letter is to inform you that your recent test(s):  ___x____Pap Smear    _______Lab Test     _______X-ray    ___x____ is within acceptable limits  _______ requires a medication change  _______ requires a follow-up lab visit  _______ requires a follow-up visit with your provider   Comments:       _________________________________________________________ If you have any questions, please contact our office                     Sincerely,  Tereso Newcomer PA-C [facility]

## 2010-05-06 NOTE — Assessment & Plan Note (Signed)
Summary: Acute - Sinusitis   Vital Signs:  Patient profile:   50 year old female Menstrual status:  regular LMP:     12/22/2009 Weight:      180.2 pounds Temp:     98.0 degrees F oral Pulse rate:   80 / minute Pulse rhythm:   regular Resp:     20 per minute BP sitting:   110 / 76  (left arm) Cuff size:   large  Vitals Entered By: Michelle Nasuti (January 15, 2010 8:47 AM) CC: c/o sore throat x 1 week, Hypertension Management, Lipid Management Is Patient Diabetic? Yes Pain Assessment Patient in pain? yes     Location: throat Intensity: 9 CBG Result 155 CBG Device ID a nf  Does patient need assistance? Functional Status Self care Ambulation Normal LMP (date): 12/22/2009 Menarche (age onset years): 13   Menses interval (days): 28 Menstrual flow (days): 5 Enter LMP: 12/22/2009 Last PAP Result  Specimen Adequacy: Satisfactory for evaluation.   Interpretation/Result:Negative for intraepithelial Lesion or Malignancy.   Interpretation/Result:Reactive Changes (Benign).     CC:  c/o sore throat x 1 week, Hypertension Management, and Lipid Management.  History of Present Illness:  Pt into the office for with complaints of sore throat. Fever x 1 week ago with onset of symptoms. She was seen at the urgent care and was prescribed Cheratussin syrup.  Symptoms have worsened. Currently she has lost her voice for the past 2 days +sore throat +headache +Slight cough but syrup helps some +Bilateral ear pain +fever; usually at night  Obesity - down 5 pounds since last visit.  Hypertension History:      She denies headache, chest pain, and palpitations.        Positive major cardiovascular risk factors include diabetes, hyperlipidemia, hypertension, and family history for ischemic heart disease (females less than 18 years old).  Negative major cardiovascular risk factors include female age less than 27 years old and non-tobacco-user status.        Further assessment for target  organ damage reveals no history of ASHD, cardiac end-organ damage (CHF/LVH), stroke/TIA, peripheral vascular disease, renal insufficiency, or hypertensive retinopathy.    Lipid Management History:      Positive NCEP/ATP III risk factors include diabetes, family history for ischemic heart disease (females less than 62 years old), and hypertension.  Negative NCEP/ATP III risk factors include female age less than 68 years old, non-tobacco-user status, no ASHD (atherosclerotic heart disease), no prior stroke/TIA, no peripheral vascular disease, and no history of aortic aneurysm.      Allergies: No Known Drug Allergies  Review of Systems General:  Complains of fever; denies loss of appetite. ENT:  Complains of earache and sore throat; denies nasal congestion. CV:  Denies chest pain or discomfort. Resp:  Complains of cough; denies shortness of breath. GI:  Denies abdominal pain, nausea, and vomiting.  Physical Exam  General:  alert.   Head:  normocephalic.   Eyes:  pupils round.   Ears:  bil ears with moderate cerument Mouth:  hoarse voice tonsillar enlargement +2 (beefy red) uvula midline Lungs:  normal breath sounds.   Heart:  normal rate and regular rhythm.   Abdomen:  normal bowel sounds.   Msk:  up to the exam table Neurologic:  alert & oriented X3.    Diabetes Management Exam:    Foot Exam (with socks and/or shoes not present):       Sensory-Monofilament:          Left  foot: normal          Right foot: normal   Impression & Recommendations:  Problem # 1:  ACUTE SINUSITIS, UNSPECIFIED (ICD-461.9) rapid strep negative  symptoms over 10 days - will treat with ABX will check cbc advised symptomatic management as well with ibuprofen, gargle with warm salt water ibuprofen 600mg  suspension given by mouth in office The following medications were removed from the medication list:    Veramyst 27.5 Mcg/spray Susp (Fluticasone furoate) .Marland Kitchen... 1 spray in each nostril two times a  day Her updated medication list for this problem includes:    Amoxicillin 500 Mg Tabs (Amoxicillin) ..... One tablet by mouth three times a day for infection  Orders: Rapid Strep ([address]) T-CBC w/Diff ([address]11)  Problem # 2:  DIABETES MELLITUS, TYPE II (ICD-250.00) will check Hgba1c today Her updated medication list for this problem includes:    Metformin Hcl 500 Mg Tabs (Metformin hcl) ..... One tablet by mouth two times a day    Glucotrol Xl 10 Mg Tb24 (Glipizide) .Marland Kitchen... Take 1 tablet by mouth every morning    Lisinopril 5 Mg Tabs (Lisinopril) .Marland Kitchen... Take 1 tablet by mouth every morning for blood pressure and kidney protection    Bayer Low Strength 81 Mg Tbec (Aspirin) ..... One tablet by mouth daily  Orders: Capillary Blood Glucose/CBG ([address])  Problem # 3:  OBESITY (ICD-278.00) down 5 pounds since last visit. advised pt to keep up the good work  Complete Medication List: 1)  Metformin Hcl 500 Mg Tabs (Metformin hcl) .... One tablet by mouth two times a day 2)  Glucotrol Xl 10 Mg Tb24 (Glipizide) .... Take 1 tablet by mouth every morning 3)  Lisinopril 5 Mg Tabs (Lisinopril) .... Take 1 tablet by mouth every morning for blood pressure and kidney protection 4)  Bayer Low Strength 81 Mg Tbec (Aspirin) .... One tablet by mouth daily 5)  Ventolin Hfa 108 (90 Base) Mcg/act Aers (Albuterol sulfate) .... One puff every 4-6 hours as needed for shortness of breath 6)  Amoxicillin 500 Mg Tabs (Amoxicillin) .... One tablet by mouth three times a day for infection  Other Orders: T- Hemoglobin A1C ([address]562-13086)  Hypertension Assessment/Plan:      The patient's hypertensive risk group is category C: Target organ damage and/or diabetes.  Her calculated 10 year risk of coronary heart disease is 6 %.  Today's blood pressure is 110/76.  Her blood pressure goal is < 130/80.  Lipid Assessment/Plan:      Based on NCEP/ATP III, the patient's risk factor category is "history of diabetes".  The  patient's lipid goals are as follows: Total cholesterol goal is 200; LDL cholesterol goal is 100; HDL cholesterol goal is 40; Triglyceride goal is 150.    Patient Instructions: 1)  Schedule for flu clinic in November for the flu vaccine.  You should be better by this time. 2)  You complete physical exam will be due in January 2012. 3)  Schedule this appointment.  Come fasting - no food after midnight before this appointment.  Will need labs, mammogram, PAP, PHQ-9. 4)  Take amoxil 500mg  by mouth three times a day x 10 days.   5)  Take this medication with food so it does not upset your stomach. 6)  May take ibuprofen 600mg  by mouth two times a day as needed for fever and pain in throat (take3  over the counter pills that are 200mg  each) Prescriptions: AMOXICILLIN 500 MG TABS (AMOXICILLIN) One tablet by mouth  three times a day for infection  #30 x 0   Entered and Authorized by:   Lehman Prom FNP   Signed by:   Lehman Prom FNP on 01/15/2010   Method used:   Print then Give to Patient   RxID:   [id number]   Last LDL:                                                 105 (04/21/2009 10:35:00 PM)        Diabetic Foot Exam    10-g (5.07) Semmes-Weinstein Monofilament Test Performed by: Michelle Nasuti          Right Foot          Left Foot Visual Inspection               Test Control      normal         normal Site 1         normal         normal Site 2         normal         normal Site 3         normal         normal Site 4         normal         normal Site 5         normal         normal Site 6         normal         normal Site 7         normal         normal Site 8         normal         normal Site 9         normal         normal Site 10         normal         normal  Impression      normal         normal   Laboratory Results   Blood Tests     CBG Random:: 155mg /dL  Date/Time Received: January 15, 2010 9:08 AM   Other Tests  Rapid Strep:  negative    Orders Added: 1)  Est. Patient Level III [[address]782] 2)  Capillary Blood Glucose/CBG [82948] 3)  Rapid Strep [87880] 4)  T-CBC w/Diff [95621-30865] 5)  T- Hemoglobin A1C [83036-23375]  Appended Document: Acute - Sinusitis   Medication Administration  Medication # 1:    Medication: Ibuprofen 200mg  tab    Diagnosis: ACUTE SINUSITIS, UNSPECIFIED (ICD-461.9)    Dose: 3 tablets    Route: po    Exp Date: 06/2010    Lot #: [id number]    Mfr: major    Comments: this was liquid form and pt recieved 600mg     Patient tolerated medication without complications    Given by: [place] Shannon (January 18, 2010 9:11 AM)  Orders Added: 1)  Ibuprofen 200mg  tab [EMRORAL]

## 2010-05-06 NOTE — Miscellaneous (Signed)
Summary: Pap Normal   Clinical Lists Changes  Observations: Added new observation of PAP SMEAR:  Specimen Adequacy: Satisfactory for evaluation.   Interpretation/Result:Negative for intraepithelial Lesion or Malignancy.   Interpretation/Result:Reactive Changes (Benign).   (04/01/2009 13:11)      Pap Smear  Procedure date:  04/01/2009  Findings:       Specimen Adequacy: Satisfactory for evaluation.   Interpretation/Result:Negative for intraepithelial Lesion or Malignancy.   Interpretation/Result:Reactive Changes (Benign).    Comments:      Repeat Pap in 1 year.

## 2010-05-06 NOTE — Letter (Signed)
Summary: GYNECOLOGIC CYTOLOGY REPORT  GYNECOLOGIC CYTOLOGY REPORT   Imported By: Arta Bruce 05/27/2009 15:46:59  _____________________________________________________________________  External Attachment:    Type:   Image     Comment:   External Document

## 2010-05-06 NOTE — Letter (Signed)
Summary: *HSN Results Follow up  [facility]  [address] [place], [place] [address]   Phone: [phone]  Fax: [phone]      04/22/2009   [facility] [place] [address] [place], [place]  [address]   Dear  Ms. [name],                            ____S.Drinkard,FNP   ____D. Gore,FNP       ____B. McPherson,MD   ____V. Rankins,MD    ____E. Mulberry,MD    __X__N. Daphine Deutscher, FNP  ____D. Reche Dixon, MD    ____K. Philipp Deputy, MD    ____Other     This letter is to inform you that your recent test(s):  _______Pap Smear    ___X____Lab Test     _______X-ray      Comments:  Labs done during recent office visit show that your blood sugar was slightly elevated.  Be sure you are taking your blood sugar medications as ordered. Also your "Bad Cholesterol" is a little high. No need for medication at this time but avoid fried fatty foods.     _________________________________________________________ If you have any questions, please contact our office [phone].                    Sincerely,    Kristie Prom FNP [facility]

## 2010-05-06 NOTE — Assessment & Plan Note (Signed)
Summary: Acute - Left arm numbness   Vital Signs:  Patient profile:   50 year old female Menstrual status:  regular Weight:      185.7 pounds BMI:     31.99 BSA:     1.90 Temp:     98.1 degrees F oral Pulse rate:   79 / minute Pulse rhythm:   regular Resp:     80 per minute BP sitting:   106 / 71  (left arm) Cuff size:   large  Vitals Entered By: Levon Hedger (April 29, 2009 11:42 AM) CC: left arm is numb and heavy since last night Is Patient Diabetic? Yes Pain Assessment Patient in pain? no      CBG Result 131 CBG Device ID A  Does patient need assistance? Functional Status Self care Ambulation Normal   CC:  left arm is numb and heavy since last night.  History of Present Illness:  Pt into the office with numbness in the left arm from the shoulder down to the fingers. Noticed when she woke this morning Reports that she usually sleeps on her right side No straining activity on previous day. Tries to do exercise to assist with the heaviness and numbness.  Diabetes - unable to take her blood sugar medications for 2 days due to inability to get refills. She has been able to get the refills transferred to another pharmacy and has now purchased them  Allergies (verified): No Known Drug Allergies  Review of Systems CV:  Complains of chest pain or discomfort and fatigue. Resp:  Complains of shortness of breath; denies cough. GI:  Denies abdominal pain, nausea, and vomiting. Neuro:  Complains of tingling. Endo:  Complains of excessive urination.  Physical Exam  General:  alert.   Head:  normocephalic.   Breasts:  left axilla - ? lipoma, nontender Lungs:  normal breath sounds.   Heart:  normal rate and regular rhythm.   Msk:  up to the exam table Pulses:  L radial normal.   Neurologic:  alert & oriented X3.     Shoulder/Elbow Exam  Shoulder Exam:    Left:    Inspection:  Normal    Palpation:  Normal    Stability:  stable    Tenderness:  no  Swelling:  no    Erythema:  no   Impression & Recommendations:  Problem # 1:  TINGLING (ICD-782.0) most likely from elevated blood sugar  pt has restarted her meds handout given aleve samples x 4 given with instructions for use active ROM, pulses intact, no swelling  Problem # 2:  DIABETES MELLITUS II, UNCOMPLICATED (ICD-250.00) restart on meds Her updated medication list for this problem includes:    Metformin Hcl 500 Mg Tabs (Metformin hcl) ..... One tablet by mouth two times a day    Glucotrol Xl 10 Mg Tb24 (Glipizide) .Marland Kitchen... Take 1 tablet by mouth every morning    Lisinopril 5 Mg Tabs (Lisinopril) .Marland Kitchen... Take 1 tablet by mouth every morning for blood pressure and kidney protection    Bayer Low Strength 81 Mg Tbec (Aspirin) ..... One tablet by mouth daily  Complete Medication List: 1)  Metformin Hcl 500 Mg Tabs (Metformin hcl) .... One tablet by mouth two times a day 2)  Glucotrol Xl 10 Mg Tb24 (Glipizide) .... Take 1 tablet by mouth every morning 3)  Lisinopril 5 Mg Tabs (Lisinopril) .... Take 1 tablet by mouth every morning for blood pressure and kidney protection 4)  Claritin 10 Mg Tabs (  Loratadine) .... Take 1 tablet by mouth once a day 5)  Veramyst 27.5 Mcg/spray Susp (Fluticasone furoate) .Marland Kitchen.. 1 spray in each nostril two times a day 6)  Advair Diskus 100-50 Mcg/dose Misc (Fluticasone-salmeterol) .Marland Kitchen.. 1 inhalation two times a day 7)  Bayer Low Strength 81 Mg Tbec (Aspirin) .... One tablet by mouth daily  Other Orders: Capillary Blood Glucose/CBG ([address])  Patient Instructions: 1)  Numbness in arm may be due to high blood sugar for the past few days.  Continue to take your diabetes medications as ordered.   2)  Also can take aleve - 2 tablets today and 2 tablets tomorrow after food. 3)  follow up as needed

## 2010-05-06 NOTE — Progress Notes (Signed)
Summary: UNDERARM IT SWOLL/ REF  Phone Note Call from Patient Call back at Home Phone [phone]   Reason for Call: Referral Summary of Call: Kristie PT. MS [place] SAYS THAT UNDER HER LEFT ARMPIT, IT IS VERY SWOLLEN AND WHEN SHE GOES TOMORROW [facility] MAM[facility]GRAM [facility] SHE WANTS TO KNOW IF THEY CAN CHECK THAT OUT. Initial call taken by: [name],  March 17, 2010 2:57 PM  Follow-up for Phone Call        Advised pt. that mammogram is only a screening test -- that she would need to be seen for evaluation and treatment.  Verbalized understanding -- confirmed appt. for 04/12/10. States that she has had swollen area for a long time -- will call back if she needs to be seen sooner.   Follow-up by: [name],  March 17, 2010 3:08 PM

## 2010-05-06 NOTE — Progress Notes (Signed)
Summary: Office Visit//DEPRESSION SCREENING  Office Visit//DEPRESSION SCREENING   Imported By: Arta Bruce 05/12/2009 12:18:27  _____________________________________________________________________  External Attachment:    Type:   Image     Comment:   External Document

## 2010-05-06 NOTE — Letter (Signed)
Summary: Handout Printed  Printed Handout:  - Neuropathy 

## 2010-05-06 NOTE — Progress Notes (Signed)
Summary: WANTS MAMMOGRAM REFERRAL  Phone Note Call from Patient Call back at [facility] Phone [phone]   Summary of Call: PT CALLING TO GET REFERRAL FOR MAMMOGRAM Initial call taken by: Hassell Halim CMA,  March 10, 2010 4:21 PM  Follow-up for Phone Call        Referral for mammogram in the basket. OK to schedule pt  Follow-up by: Lehman Prom FNP,  March 10, 2010 5:49 PM  Additional Follow-up for Phone Call Additional follow up Details #1::        mammogram scheduled. pt informed. Additional Follow-up by: Levon Hedger,  March 11, 2010 10:58 AM  New Problems: UNSPECIFIED BREAST SCREENING (ICD-V76.10)   New Problems: UNSPECIFIED BREAST SCREENING (ICD-V76.10)

## 2010-05-13 ENCOUNTER — Encounter (INDEPENDENT_AMBULATORY_CARE_PROVIDER_SITE_OTHER): Payer: Self-pay | Admitting: Nurse Practitioner

## 2010-05-13 ENCOUNTER — Encounter: Payer: Self-pay | Admitting: Nurse Practitioner

## 2010-05-13 DIAGNOSIS — D179 Benign lipomatous neoplasm, unspecified: Secondary | ICD-10-CM | POA: Insufficient documentation

## 2010-05-13 DIAGNOSIS — R3915 Urgency of urination: Secondary | ICD-10-CM | POA: Insufficient documentation

## 2010-05-13 LAB — CONVERTED CEMR LAB
Bilirubin Urine: NEGATIVE
Blood Glucose, Fingerstick: 167
Blood in Urine, dipstick: NEGATIVE
Hgb A1c MFr Bld: 6.3 %
Ketones, urine, test strip: NEGATIVE
Nitrite: NEGATIVE
Protein, U semiquant: NEGATIVE
pH: 6

## 2010-05-14 ENCOUNTER — Encounter (INDEPENDENT_AMBULATORY_CARE_PROVIDER_SITE_OTHER): Payer: Self-pay | Admitting: Nurse Practitioner

## 2010-05-20 NOTE — Assessment & Plan Note (Signed)
Summary: Acute Cystitis   Vital Signs:  Patient profile:   50 year old female Menstrual status:  regular LMP:     04/23/2010 Weight:      179.2 pounds Temp:     98.5 degrees F oral Pulse rate:   80 / minute Pulse rhythm:   regular Resp:     20 per minute BP sitting:   96 / 70  (left arm) Cuff size:   large  Vitals Entered By: Levon Hedger (May 13, 2010 3:08 PM) CC: lower abdominal pain with urinary urgency x 1 month...lump under left arm , Hypertension Management Is Patient Diabetic? Yes Pain Assessment Patient in pain? yes     Location: lower abdomen CBG Result 167 CBG Device ID A  Does patient need assistance? Functional Status Self care Ambulation Normal LMP (date): 04/23/2010 Menarche (age onset years): 13   Menses interval (days): 28 Menstrual flow (days): 5 Enter LMP: 04/23/2010 Last PAP Result  Specimen Adequacy: Satisfactory for evaluation.   Interpretation/Result:Negative for intraepithelial Lesion or Malignancy.   Interpretation/Result:Reactive Changes (Benign).     CC:  lower abdominal pain with urinary urgency x 1 month...lump under left arm  and Hypertension Management.  History of Present Illness:  Pt into the office with lower abdominal pain Started 5 days ago Pain is worse in the morning, it has decreased while waiting for her visit but she is also having some urinary problems. +urinary urgency -hematuria  Area in question under left axilla - present for many years, ? if getting larger no pain  recent mammogram in 03/2010  Diabetes Management History:      The patient is a 50 years old female who comes in for evaluation of DM Type 2.  She has not been enrolled in the "Diabetic Education Program".  She states understanding of dietary principles and is following her diet appropriately.  No sensory loss is reported.  Self foot exams are not being performed.  She is checking home blood sugars.  She says that she is exercising.  Type of  exercise includes: WALKS.  Duration of exercise is estimated to be <30  She is doing this 3 times per week.        Hypoglycemic symptoms are not occurring.  No hyperglycemic symptoms are reported.    Hypertension History:      She denies headache, chest pain, and palpitations.  She notes no problems with any antihypertensive medication side effects.        Positive major cardiovascular risk factors include diabetes, hyperlipidemia, hypertension, and family history for ischemic heart disease (females less than 73 years old).  Negative major cardiovascular risk factors include female age less than 56 years old and non-tobacco-user status.        Further assessment for target organ damage reveals no history of ASHD, cardiac end-organ damage (CHF/LVH), stroke/TIA, peripheral vascular disease, renal insufficiency, or hypertensive retinopathy.     Allergies (verified): No Known Drug Allergies  Review of Systems General:  Denies fever. CV:  Denies chest pain or discomfort. Resp:  Denies cough. GI:  Complains of abdominal pain and constipation. GU:  Complains of urinary hesitancy; denies discharge and hematuria.  Physical Exam  General:  alert.   Head:  normocephalic.   Lungs:  normal breath sounds.   Heart:  normal rate and regular rhythm.   Abdomen:  normal bowel sounds.   Msk:  normal ROM.   left axilla - lipoma Neurologic:  alert & oriented X3.  Skin:  color normal.   Psych:  Oriented X3.     Impression & Recommendations:  Problem # 1:  LIPOMA (ICD-214.9) handout given to pt  Problem # 2:  DIABETES MELLITUS, TYPE II (ICD-250.00) stable Her updated medication list for this problem includes:    Metformin Hcl 500 Mg Tabs (Metformin hcl) ..... One tablet by mouth two times a day    Glucotrol Xl 10 Mg Tb24 (Glipizide) .Marland Kitchen... Take 1 tablet by mouth every morning    Lisinopril 5 Mg Tabs (Lisinopril) .Marland Kitchen... Take 1 tablet by mouth every morning for blood pressure and kidney protection     Bayer Low Strength 81 Mg Tbec (Aspirin) ..... One tablet by mouth daily  Orders: Capillary Blood Glucose/CBG (82948) Hemoglobin A1C (83036)  Problem # 3:  OBESITY (ICD-278.00) advised pt to keep with weight loss  Problem # 4:  HYPERTENSION (ICD-401.9) stable Her updated medication list for this problem includes:    Lisinopril 5 Mg Tabs (Lisinopril) .Marland Kitchen... Take 1 tablet by mouth every morning for blood pressure and kidney protection  Orders: Capillary Blood Glucose/CBG (82948) Hemoglobin A1C (24401)  Problem # 5:  URINARY URGENCY (UUV-253.66) will send urine for culture advised pt to drink more water - hygiene (wipe from front to back) Orders: UA Dipstick w/o Micro (manual) (44034) T-Culture, Urine ([id number])  Complete Medication List: 1)  Metformin Hcl 500 Mg Tabs (Metformin hcl) .... One tablet by mouth two times a day 2)  Glucotrol Xl 10 Mg Tb24 (Glipizide) .... Take 1 tablet by mouth every morning 3)  Lisinopril 5 Mg Tabs (Lisinopril) .... Take 1 tablet by mouth every morning for blood pressure and kidney protection 4)  Bayer Low Strength 81 Mg Tbec (Aspirin) .... One tablet by mouth daily 5)  Ventolin Hfa 108 (90 Base) Mcg/act Aers (Albuterol sulfate) .... One puff every 4-6 hours as needed for shortness of breath 6)  Xyzal 5 Mg Tabs (Levocetirizine dihydrochloride) .... One tablet by mouth daily for allergies 7)  Ciprofloxacin Hcl 500 Mg Tabs (Ciprofloxacin hcl) .... One tablet by mouth two times a day for infection 8)  Amitiza 8 Mcg Caps (Lubiprostone) .... One capsule by mouth two times a day after food  Diabetes Management Assessment/Plan:      The following lipid goals have been established for the patient: Total cholesterol goal of 200; LDL cholesterol goal of 100; HDL cholesterol goal of 40; Triglyceride goal of 150.  Her blood pressure goal is < 130/80.    Hypertension Assessment/Plan:      The patient's hypertensive risk group is category C: Target organ damage  and/or diabetes.  Her calculated 10 year risk of coronary heart disease is 6 %.  Today's blood pressure is 96/70.  Her blood pressure goal is < 130/80.  Patient Instructions: 1)  Constipation - try to add more fiber in your diet with raisins, apples, oatmeal. 2)  You can also take amitiza by mouth two times a day  3)  Eat food then wait about 30 minutes and take the medications 4)  Complete supply of samples and if you see some benefit then notify this office and you can take daily. 5)  Urine - most likely caused by infection  6)  it will be sent to the lab for culture. 7)  you need to drink more water - alternate between water and tea. Take cipro 500mg  by mouth two times a day x 7 days. 8)  Follow up as needed 9)  You  need a tdap on your next visit Prescriptions: AMITIZA 8 MCG CAPS (LUBIPROSTONE) One capsule by mouth two times a day after food  #16 x 0   Entered and Authorized by:   Lehman Prom FNP   Signed by:   Lehman Prom FNP on 05/13/2010   Method used:   Samples Given   RxID:   [id number] CIPROFLOXACIN HCL 500 MG TABS (CIPROFLOXACIN HCL) One tablet by mouth two times a day for infection  #14 x 0   Entered and Authorized by:   Lehman Prom FNP   Signed by:   Lehman Prom FNP on 05/13/2010   Method used:   Print then Give to Patient   RxID:   [id number]    Orders Added: 1)  Capillary Blood Glucose/CBG [82948] 2)  Est. Patient Level IV [24401] 3)  Hemoglobin A1C [83036] 4)  UA Dipstick w/o Micro (manual) [81002] 5)  T-Culture, Urine [[id number]]    Prevention & Chronic Care Immunizations   Influenza vaccine: refused  (04/01/2009)    Tetanus booster: 04/05/1995: historical per pt   Td booster deferral: Refused  (05/13/2010)    Pneumococcal vaccine: refused  (04/01/2009)  Other Screening   Pap smear:  Specimen Adequacy: Satisfactory for evaluation.   Interpretation/Result:Negative for intraepithelial Lesion or Malignancy.    Interpretation/Result:Reactive Changes (Benign).    (04/01/2009)   Pap smear action/deferral: Ordered  (04/01/2009)    Mammogram: ASSESSMENT: Negative - BI-RADS 1^MM DIGITAL SCREENING  (03/18/2010)   Mammogram action/deferral: Deferred  (04/01/2009)   Mammogram due: 05/2010   Smoking status: never  (07/10/2008)  Diabetes Mellitus   HgbA1C: 6.3  (05/13/2010)   HgbA1C action/deferral: Ordered  (04/01/2009)   Hemoglobin A1C due: 06/30/2009    Eye exam: Not documented    Foot exam: yes  (01/15/2010)   Foot exam action/deferral: Do today   High risk foot: Not documented   Foot care education: Not documented    Urine microalbumin/creatinine ratio: Not documented   Urine microalbumin action/deferral: Ordered  Lipids   Total Cholesterol: 164  (04/21/2009)   LDL: 105  (04/21/2009)   LDL Direct: Not documented   HDL: 45  (04/21/2009)   Triglycerides: 69  (04/21/2009)    SGOT (AST): 11  (04/21/2009)   SGPT (ALT): 16  (04/21/2009)   Alkaline phosphatase: 82  (04/21/2009)   Total bilirubin: 0.3  (04/21/2009)  Hypertension   Last Blood Pressure: 96 / 70  (05/13/2010)   Serum creatinine: 0.45  (04/21/2009)   Serum potassium 3.8  (04/21/2009)  Self-Management Support :    Diabetes self-management support: Not documented    Hypertension self-management support: Not documented    Lipid self-management support: Not documented    Laboratory Results   Urine Tests  Date/Time Received: May 13, 2010 3:56 PM   Routine Urinalysis   Color: lt. yellow Glucose: 100   (Normal Range: Negative) Bilirubin: negative   (Normal Range: Negative) Ketone: negative   (Normal Range: Negative) Spec. Gravity: >=1.030   (Normal Range: 1.003-1.035) Blood: negative   (Normal Range: Negative) pH: 6.0   (Normal Range: 5.0-8.0) Protein: negative   (Normal Range: Negative) Urobilinogen: 0.2   (Normal Range: 0-1) Nitrite: negative   (Normal Range: Negative) Leukocyte Esterace: negative    (Normal Range: Negative)     Blood Tests   Date/Time Received: May 13, 2010 3:49 PM   HGBA1C: 6.3%   (Normal Range: Non-Diabetic - 3-6%   Control Diabetic - 6-8%) CBG Random:: 167mg /dL

## 2010-05-20 NOTE — Letter (Signed)
Summary: Handout Printed  Printed Handout:  - Lipoma (Fatty Tumor) 

## 2010-06-04 ENCOUNTER — Other Ambulatory Visit: Payer: Self-pay | Admitting: Nurse Practitioner

## 2010-06-04 ENCOUNTER — Telehealth (INDEPENDENT_AMBULATORY_CARE_PROVIDER_SITE_OTHER): Payer: Self-pay | Admitting: Nurse Practitioner

## 2010-06-04 ENCOUNTER — Encounter: Payer: Self-pay | Admitting: Nurse Practitioner

## 2010-06-04 ENCOUNTER — Encounter (INDEPENDENT_AMBULATORY_CARE_PROVIDER_SITE_OTHER): Payer: Self-pay | Admitting: Nurse Practitioner

## 2010-06-04 LAB — CONVERTED CEMR LAB
AST: 11 units/L (ref 0–37)
Albumin: 4.3 g/dL (ref 3.5–5.2)
Alkaline Phosphatase: 80 units/L (ref 39–117)
Bilirubin Urine: NEGATIVE
KOH Prep: NEGATIVE
Ketones, urine, test strip: NEGATIVE
OCCULT 1: NEGATIVE
Potassium: 3.7 meq/L (ref 3.5–5.3)
Protein, U semiquant: NEGATIVE
Sodium: 138 meq/L (ref 135–145)
Total Protein: 7.3 g/dL (ref 6.0–8.3)
Urobilinogen, UA: 0.2

## 2010-06-07 ENCOUNTER — Encounter (INDEPENDENT_AMBULATORY_CARE_PROVIDER_SITE_OTHER): Payer: Self-pay | Admitting: Nurse Practitioner

## 2010-06-10 ENCOUNTER — Encounter (INDEPENDENT_AMBULATORY_CARE_PROVIDER_SITE_OTHER): Payer: Self-pay | Admitting: Nurse Practitioner

## 2010-06-13 ENCOUNTER — Emergency Department (HOSPITAL_COMMUNITY): Payer: Self-pay

## 2010-06-13 ENCOUNTER — Inpatient Hospital Stay (INDEPENDENT_AMBULATORY_CARE_PROVIDER_SITE_OTHER)
Admission: RE | Admit: 2010-06-13 | Discharge: 2010-06-13 | Disposition: A | Discharge status: Home / Self Care | Payer: Self-pay | Source: Ambulatory Visit | Attending: Family Medicine | Admitting: Family Medicine

## 2010-06-13 ENCOUNTER — Emergency Department (HOSPITAL_COMMUNITY)
Admission: EM | Admit: 2010-06-13 | Discharge: 2010-06-14 | Disposition: A | Discharge status: Home / Self Care | Payer: Self-pay | Attending: Emergency Medicine | Admitting: Emergency Medicine

## 2010-06-13 DIAGNOSIS — K8020 Calculus of gallbladder without cholecystitis without obstruction: Secondary | ICD-10-CM | POA: Insufficient documentation

## 2010-06-13 DIAGNOSIS — I10 Essential (primary) hypertension: Secondary | ICD-10-CM | POA: Insufficient documentation

## 2010-06-13 DIAGNOSIS — E119 Type 2 diabetes mellitus without complications: Secondary | ICD-10-CM | POA: Insufficient documentation

## 2010-06-13 DIAGNOSIS — K5900 Constipation, unspecified: Secondary | ICD-10-CM | POA: Insufficient documentation

## 2010-06-13 DIAGNOSIS — R109 Unspecified abdominal pain: Secondary | ICD-10-CM

## 2010-06-13 DIAGNOSIS — R112 Nausea with vomiting, unspecified: Secondary | ICD-10-CM | POA: Insufficient documentation

## 2010-06-13 DIAGNOSIS — Z79899 Other long term (current) drug therapy: Secondary | ICD-10-CM | POA: Insufficient documentation

## 2010-06-13 DIAGNOSIS — R1033 Periumbilical pain: Secondary | ICD-10-CM | POA: Insufficient documentation

## 2010-06-13 LAB — CBC
MCH: 29.9 pg (ref 26.0–34.0)
MCHC: 34.9 g/dL (ref 30.0–36.0)
Platelets: 258 10*3/uL (ref 150–400)
RBC: 4.02 MIL/uL (ref 3.87–5.11)

## 2010-06-13 LAB — URINALYSIS, ROUTINE W REFLEX MICROSCOPIC
Bilirubin Urine: NEGATIVE
Glucose, UA: NEGATIVE mg/dL
Hgb urine dipstick: NEGATIVE
Ketones, ur: NEGATIVE mg/dL
Nitrite: NEGATIVE
Protein, ur: NEGATIVE mg/dL
Specific Gravity, Urine: 1.014 (ref 1.005–1.030)
Urobilinogen, UA: 0.2 mg/dL (ref 0.0–1.0)
pH: 7 (ref 5.0–8.0)

## 2010-06-13 LAB — POCT URINALYSIS DIPSTICK
Bilirubin Urine: NEGATIVE
Glucose, UA: NEGATIVE mg/dL
Hgb urine dipstick: NEGATIVE
Specific Gravity, Urine: 1.02 (ref 1.005–1.030)
Urobilinogen, UA: 0.2 mg/dL (ref 0.0–1.0)
pH: 7 (ref 5.0–8.0)

## 2010-06-13 LAB — COMPREHENSIVE METABOLIC PANEL
AST: 23 U/L (ref 0–37)
Albumin: 3.6 g/dL (ref 3.5–5.2)
Calcium: 9 mg/dL (ref 8.4–10.5)
Chloride: 106 mEq/L (ref 96–112)
Creatinine, Ser: 0.47 mg/dL (ref 0.4–1.2)
GFR calc Af Amer: >60 mL/min (ref >60)

## 2010-06-13 LAB — DIFFERENTIAL
Basophils Relative: 1 % (ref 0–1)
Eosinophils Relative: 2 % (ref 0–5)
Monocytes Relative: 8 % (ref 3–12)
Neutrophils Relative %: 44 % (ref 43–77)

## 2010-06-13 MED ORDER — IOHEXOL 300 MG/ML  SOLN
100.0000 mL | Freq: Once | INTRAMUSCULAR | Status: AC | PRN
  Administered 2010-06-13: 100 mL via INTRAVENOUS

## 2010-06-14 ENCOUNTER — Telehealth (INDEPENDENT_AMBULATORY_CARE_PROVIDER_SITE_OTHER): Payer: Self-pay | Admitting: Nurse Practitioner

## 2010-06-14 DIAGNOSIS — K8020 Calculus of gallbladder without cholecystitis without obstruction: Secondary | ICD-10-CM | POA: Insufficient documentation

## 2010-06-15 ENCOUNTER — Telehealth (INDEPENDENT_AMBULATORY_CARE_PROVIDER_SITE_OTHER): Payer: Self-pay | Admitting: Nurse Practitioner

## 2010-06-15 ENCOUNTER — Encounter (INDEPENDENT_AMBULATORY_CARE_PROVIDER_SITE_OTHER): Payer: Self-pay | Admitting: Nurse Practitioner

## 2010-06-15 NOTE — Assessment & Plan Note (Signed)
Summary: Complete Physical Exam   Vital Signs:  Patient profile:   50 year old female Menstrual status:  regular LMP:     04/23/2010 Weight:      176.9 pounds BMI:     30.47 Temp:     98.1 degrees F oral Pulse rate:   66 / minute Pulse rhythm:   regular Resp:     16 per minute BP sitting:   122 / 81  (left arm) Cuff size:   large  Vitals Entered By: Levon Hedger (June 04, 2010 3:07 PM)  Nutrition Counseling: Patient's BMI is greater than 25 and therefore counseled on weight management options. CC: CPP, Hypertension Management, Lipid Management Is Patient Diabetic? Yes Pain Assessment Patient in pain? no      CBG Result 124  Does patient need assistance? Functional Status Self care Ambulation Normal LMP (date): 04/23/2010 Menarche (age onset years): 13   Menses interval (days): 28 Menstrual flow (days): 5 Enter LMP: 04/23/2010 Last PAP Result  Specimen Adequacy: Satisfactory for evaluation.   Interpretation/Result:Negative for intraepithelial Lesion or Malignancy.   Interpretation/Result:Reactive Changes (Benign).     CC:  CPP, Hypertension Management, and Lipid Management.  History of Present Illness:  Pt into the office today for a complete physical exam  PAP - last done 2 years ago. Menses monthly. S/p tubal ligation. Pt is married.  Mammogram - last done 12/11.   Optho - no recent eye exam. Pt is having some problems with reading and close vision. She does not have any problems with distance vision  Dental - no recent dental exam.  no current problems with tooth  obesity - down 3 pounds since last visit  tdap - last done in 1995  *Pt had an unwitnessed fall in the office while she was in the exam room waiting for the provider.   exam light broken. pt says she was trying to get off the exam table and she lost her footing.  Pt denies any head injuries  Hypertension History:      She denies headache, chest pain, and palpitations.  She notes no  problems with any antihypertensive medication side effects.        Positive major cardiovascular risk factors include diabetes, hyperlipidemia, hypertension, and family history for ischemic heart disease (females less than 16 years old).  Negative major cardiovascular risk factors include female age less than 29 years old and non-tobacco-user status.        Further assessment for target organ damage reveals no history of ASHD, cardiac end-organ damage (CHF/LVH), stroke/TIA, peripheral vascular disease, renal insufficiency, or hypertensive retinopathy.    Lipid Management History:      Positive NCEP/ATP III risk factors include diabetes, family history for ischemic heart disease (females less than 46 years old), and hypertension.  Negative NCEP/ATP III risk factors include female age less than 45 years old, non-tobacco-user status, no ASHD (atherosclerotic heart disease), no prior stroke/TIA, no peripheral vascular disease, and no history of aortic aneurysm.        The patient states that she knows about the "Therapeutic Lifestyle Change" diet.  The patient does not know about adjunctive measures for cholesterol lowering.  She expresses no side effects from her lipid-lowering medication.  Comments include: pt is not fasting today for labs.  The patient denies any symptoms to suggest myopathy or liver disease.     Habits & Providers  Alcohol-Tobacco-Diet     Alcohol drinks/day: 0     Tobacco Status: never  Passive Smoke Exposure: no  Exercise-Depression-Behavior     Does Patient Exercise: yes     Type of exercise: WALKS     Exercise (avg: min/session): <30     Times/week: 3     Have you felt down or hopeless? no     Have you felt little pleasure in things? no     Depression Counseling: not indicated; screening negative for depression     Drug Use: no     Seat Belt Use: 100     Sun Exposure: occasionally  Comments: PHQ-9 score = 0  Allergies (verified): No Known Drug  Allergies  Review of Systems General:  Denies fever. Eyes:  Denies blurring. ENT:  Complains of decreased hearing. CV:  Denies chest pain or discomfort. Resp:  Denies cough. GI:  Denies abdominal pain, nausea, and vomiting. GU:  Complains of urinary hesitancy. MS:  Denies joint pain. Derm:  Denies dryness. Neuro:  Denies headaches. Psych:  Denies anxiety and depression.  Physical Exam  General:  alert.  obese Head:  normocephalic.   Eyes:  pupils round.   Ears:  bil ear impaction Nose:  no nasal discharge.   Mouth:  fair dentition.   Neck:  supple.   Chest Wall:  no mass.   Breasts:  no masses.   Lungs:  normal breath sounds.   Heart:  normal rate and regular rhythm.   Abdomen:  normal bowel sounds.   Rectal:  no external abnormalities.    Pelvic Exam  Vulva:      normal appearance.   Urethra and Bladder:      Urethra--normal.   Vagina:      physiologic discharge.   Cervix:      midposition.   Uterus:      smooth.   Adnexa:      nontender bilaterally.   Rectum:      heme negative stool.      Impression & Recommendations:  Problem # 1:  ROUTINE GYNECOLOGICAL EXAMINATION (ICD-V72.31) rec optho and dental guaiac negative tdap given today Orders: Hemoccult Guaiac-1 spec.(in office) (82270) UA Dipstick w/o Micro (manual) (81191) EKG w/ Interpretation (93000) T-Comprehensive Metabolic Panel (47829-56213) T-HIV Antibody  (Reflex) (08657-84696) T-Syphilis Test (RPR) (29528-41324) T-TSH (40102-72536) T-Urine Microalbumin w/creat. ratio [phone]) KOH/ WET Mount [phone]) Pap Smear, Thin Prep ( Collection of) [id number])  Problem # 2:  DIABETES MELLITUS, TYPE II (ICD-250.00) Hgba1c = 6.3 on 05/13/2010 advised pt to keep taking meds as ordered Her updated medication list for this problem includes:    Metformin Hcl 500 Mg Tabs (Metformin hcl) ..... One tablet by mouth two times a day    Glucotrol Xl 10 Mg Tb24 (Glipizide) .Marland Kitchen... Take 1 tablet by mouth every  morning    Lisinopril 5 Mg Tabs (Lisinopril) .Marland Kitchen... Take 1 tablet by mouth every morning for blood pressure and kidney protection    Bayer Low Strength 81 Mg Tbec (Aspirin) ..... One tablet by mouth daily  Orders: Capillary Blood Glucose/CBG (18841)  Problem # 3:  HYPERLIPIDEMIA (ICD-272.4) pt is not fasting today  will have her return for labs  Problem # 4:  HYPERTENSION (ICD-401.9) BP is stable DASH diet Her updated medication list for this problem includes:    Lisinopril 5 Mg Tabs (Lisinopril) .Marland Kitchen... Take 1 tablet by mouth every morning for blood pressure and kidney protection  Problem # 5:  CERUMEN IMPACTION, BILATERAL (ICD-380.4)  Orders: Cerumen Impaction Removal (66063)  Complete Medication List: 1)  Metformin Hcl 500 Mg Tabs (  Metformin hcl) .... One tablet by mouth two times a day 2)  Glucotrol Xl 10 Mg Tb24 (Glipizide) .... Take 1 tablet by mouth every morning 3)  Lisinopril 5 Mg Tabs (Lisinopril) .... Take 1 tablet by mouth every morning for blood pressure and kidney protection 4)  Bayer Low Strength 81 Mg Tbec (Aspirin) .... One tablet by mouth daily 5)  Ventolin Hfa 108 (90 Base) Mcg/act Aers (Albuterol sulfate) .... One puff every 4-6 hours as needed for shortness of breath 6)  Xyzal 5 Mg Tabs (Levocetirizine dihydrochloride) .... One tablet by mouth daily for allergies 7)  Amitiza 8 Mcg Caps (Lubiprostone) .... One capsule by mouth two times a day after food  Other Orders: Ophthalmology Referral (Ophthalmology) Tdap => 109yrs IM ([address]) Admin 1st Vaccine (60454)  Hypertension Assessment/Plan:      The patient's hypertensive risk group is category C: Target organ damage and/or diabetes.  Her calculated 10 year risk of coronary heart disease is 9 %.  Today's blood pressure is 122/81.  Her blood pressure goal is < 130/80.  Lipid Assessment/Plan:      Based on NCEP/ATP III, the patient's risk factor category is "history of diabetes".  The patient's lipid goals are as  follows: Total cholesterol goal is 200; LDL cholesterol goal is 100; HDL cholesterol goal is 40; Triglyceride goal is 150.     Patient Instructions: 1)  Schedule appointment for fasing lab - lipid (272.4) 2)  You will be referred to see if you can get your eyes checked since you are diabetic and is having some vision changes 3)  You have been given a tetanus/pertussis booster today. it will be good for 10 years 4)  [facility] can go to [facility] to get your teeth cleaned. Call to make your own appoinment 5)  Follow up in 3 months for diabetes or sooner if necessary   Orders Added: 1)  Capillary Blood Glucose/CBG [82948] 2)  Est. Patient age 30-64 [65] 3)  Hemoccult Guaiac-1 spec.(in office) [82270] 4)  UA Dipstick w/o Micro (manual) [81002] 5)  EKG w/ Interpretation [93000] 6)  T-Comprehensive Metabolic Panel [80053-22900] 7)  T-HIV Antibody  (Reflex) [09811-91478] 8)  T-Syphilis Test (RPR) [29562-13086] 9)  T-TSH [57846-96295] 10)  T-Urine Microalbumin w/creat. ratio [82043-[address]] 11)  KOH/ WET Mount [87210] 12)  Pap Smear, Thin Prep ( Collection of) [Q0091] 13)  Ophthalmology Referral [Ophthalmology] 14)  Cerumen Impaction Removal [69210] 15)  Tdap => 20yrs IM [90715] 16)  Admin 1st Vaccine [90471]   Immunizations Administered:  Tetanus Vaccine:    Vaccine Type: Tdap    Site: left deltoid    Mfr: Sanofi Pasteur    Dose: 0.5 ml    Route: IM    Given by: Levon Hedger    Exp. Date: 08/31/2012    Lot #: [id number]    VIS given: 02/20/08 version given June 04, 2010.    ndc  40102-725-36  Immunizations Administered:  Tetanus Vaccine:    Vaccine Type: Tdap    Site: left deltoid    Mfr: Sanofi Pasteur    Dose: 0.5 ml    Route: IM    Given by: Levon Hedger    Exp. Date: 08/31/2012    Lot #: [id number]    VIS given: 02/20/08 version given June 04, 2010.  Laboratory Results   Urine Tests  Date/Time Received: June 04, 2010 3:42 PM   Routine Urinalysis   Color: lt.  yellow Appearance: Clear Glucose: negative   (  Normal Range: Negative) Bilirubin: negative   (Normal Range: Negative) Ketone: negative   (Normal Range: Negative) Spec. Gravity: 1.025   (Normal Range: 1.003-1.035) Blood: negative   (Normal Range: Negative) pH: 6.5   (Normal Range: 5.0-8.0) Protein: negative   (Normal Range: Negative) Urobilinogen: 0.2   (Normal Range: 0-1) Nitrite: negative   (Normal Range: Negative) Leukocyte Esterace: negative   (Normal Range: Negative)     Blood Tests     CBG Random:: 124  Date/Time Received: June 04, 2010 1:42 PM   Wet Mount/KOH Source: vaginal WBC/hpf: 1-5 Bacteria/hpf: rare Clue cells/hpf: none Yeast/hpf: none Trichomonas/hpf: none  Stool - Occult Blood Hemmoccult #1: negative Date: 06/04/2010     EKG  Procedure date:  06/04/2010  Findings:      sinus rhythm    Laboratory Results   Urine Tests    Routine Urinalysis   Color: lt. yellow Appearance: Clear Glucose: negative   (Normal Range: Negative) Bilirubin: negative   (Normal Range: Negative) Ketone: negative   (Normal Range: Negative) Spec. Gravity: 1.025   (Normal Range: 1.003-1.035) Blood: negative   (Normal Range: Negative) pH: 6.5   (Normal Range: 5.0-8.0) Protein: negative   (Normal Range: Negative) Urobilinogen: 0.2   (Normal Range: 0-1) Nitrite: negative   (Normal Range: Negative) Leukocyte Esterace: negative   (Normal Range: Negative)     Blood Tests     CBG Random:: 124mg /dL    DIRECTV KOH: Negative  Stool - Occult Blood Hemmoccult #1: negative

## 2010-06-15 NOTE — Letter (Signed)
Summary: *HSN Results Follow up  [facility]  [address] [place], [place] [address]   Phone: [phone]  Fax: [phone]      06/07/2010   [name] [address] [place], [place]  [address]   Dear  Ms. [name],                            ____S.Drinkard,FNP   ____D. Gore,FNP       ____B. McPherson,MD   ____V. Rankins,MD    ____E. Mulberry,MD    __X__N. Daphine Deutscher, FNP  ____D. Reche Dixon, MD    ____K. Philipp Deputy, MD    ____Other     This letter is to inform you that your recent test(s):  ___X____Pap Smear    ___X____Lab Test     _______X-ray    ___X____ is within acceptable limits  _______ requires a medication change  _______ requires a follow-up lab visit  _______ requires a follow-up visit with your provider   Comments:  Labs done during recent office visit are ok.  Pap Smear results _______________________.      _________________________________________________________ If you have any questions, please contact our office [phone].                    Sincerely,    Lehman Prom FNP [facility]

## 2010-06-15 NOTE — Progress Notes (Signed)
Summary: Optho eval  Phone Note Outgoing Call   Summary of Call: refer pt to optho - visual problems hx of diabetes needs eye exam Initial call taken by: Lehman Prom FNP,  June 04, 2010 4:05 PM  Follow-up for Phone Call        As of right now we have used all our opth slots for march & april. So I can refer the pt but it would be out of pocket but will also look into [facility](pt may qualify for free eye exam).Would this be ok? Follow-up by: Candi Leash,  June 07, 2010 10:18 AM  Additional Follow-up for Phone Call Additional follow up Details #1::        yes, that will be ok Additional Follow-up by: Lehman Prom FNP,  June 07, 2010 10:21 AM    Additional Follow-up for Phone Call Additional follow up Details #2::    Spoke to Pt today & gave her the number to [facility] [phone]. Pt will call us back if she does not qualify for free exam. Follow-up by: Candi Leash,  June 09, 2010 8:52 AM  Additional Follow-up for Phone Call Additional follow up Details #3:: Details for Additional Follow-up Action Taken: noted Additional Follow-up by: Lehman Prom FNP,  June 09, 2010 8:58 AM

## 2010-06-15 NOTE — Progress Notes (Signed)
Summary: Office Visit//[facility] EXAM  Office Visit//[facility] EXAM   Imported By: Arta Bruce 06/07/2010 10:06:39  _____________________________________________________________________  External Attachment:    Type:   Image     Comment:   External Document

## 2010-06-15 NOTE — Progress Notes (Signed)
Summary: Office Visit/ HEALTH SCREEN  Office Visit/ HEALTH SCREEN   Imported By: Arta Bruce 06/07/2010 10:08:19  _____________________________________________________________________  External Attachment:    Type:   Image     Comment:   External Document

## 2010-06-16 LAB — POCT URINALYSIS DIPSTICK
Bilirubin Urine: NEGATIVE
Glucose, UA: NEGATIVE mg/dL
Hgb urine dipstick: NEGATIVE
Nitrite: NEGATIVE
Specific Gravity, Urine: 1.01 (ref 1.005–1.030)

## 2010-06-16 LAB — POCT PREGNANCY, URINE: Preg Test, Ur: NEGATIVE

## 2010-06-17 ENCOUNTER — Encounter (INDEPENDENT_AMBULATORY_CARE_PROVIDER_SITE_OTHER): Payer: Self-pay | Admitting: Nurse Practitioner

## 2010-06-17 LAB — CONVERTED CEMR LAB
Cholesterol: 146 mg/dL (ref 0–200)
Triglycerides: 72 mg/dL (ref <150)
VLDL: 14 mg/dL (ref 0–40)

## 2010-06-18 ENCOUNTER — Encounter (INDEPENDENT_AMBULATORY_CARE_PROVIDER_SITE_OTHER): Payer: Self-pay | Admitting: Nurse Practitioner

## 2010-06-22 NOTE — Progress Notes (Signed)
Summary: acute-?gallbladder stone  Phone Note Call from Patient Call back at [phone]   Reason for Call: Acute Illness Complaint: Abdominal Pain Summary of Call: pain back left side, recent gallstone, bad nausea  Initial call taken by: Ernestine Mcmurray,  June 15, 2010 9:37 AM  Follow-up for Phone Call        Is now having bad stomach, left CP and back pain, knife-like pain.  Having nausea, denies vomiting, diarrhea.  Having constipation.  Able to eat and drink, no fever.  Symptoms are persisting and worsening.  See previous note.  Can she get urgent referral to GI from here or does she have to return to ED?  Was told by ED to f/u with provider here and GI.  To notify pt. of what's needed. Follow-up by: [name],  June 15, 2010 10:56 AM  Additional Follow-up for Phone Call Additional follow up Details #1::        Reviewed ER note, will order surgery consult but can't mandate it be urgent. (referral done - debra to schedule) Aggie Cosier - has she been taking the medications as started by ER? Additional Follow-up by: Lehman Prom FNP,  June 15, 2010 12:28 PM  New Problems: CHOLELITHIASIS [phone])   Additional Follow-up for Phone Call Additional follow up Details #2::    Has been taking medication for nausea only, no other meds.  Waiting to pick up from [facility]. States she is feeling a little better at present.  Advised to take medication as soon as it is available, to go back to ED if pain and other symptoms escalate.  Verbalized understanding and agreement.  [name]  June 15, 2010 4:24 PM   New Problems: CHOLELITHIASIS [phone])

## 2010-06-22 NOTE — Letter (Signed)
Summary: Lipid Letter  [facility]  [address] [place], [place] [address]   Phone: [phone]  Fax: [phone]    06/18/2010  Tywanda Rice [address] [place], [place]  [address]  Dear Simon Rhein:  We have carefully reviewed your last lipid profile from 06/17/2010 and the results are noted below with a summary of recommendations for lipid management.    Cholesterol:       146     Goal: less than 200   HDL "good" Cholesterol:   38     Goal: greater than 40   LDL "bad" Cholesterol:   94     Goal: less than 70   Triglycerides:       72     Goal: less than 150    Your cholesterl is doing great. Keep taking all your medications as ordered.     Current Medications: 1)    Metformin Hcl 500 Mg  Tabs (Metformin hcl) .... One tablet by mouth two times a day 2)    Glucotrol Xl 10 Mg  Tb24 (Glipizide) .... Take 1 tablet by mouth every morning 3)    Lisinopril 5 Mg Tabs (Lisinopril) .... Take 1 tablet by mouth every morning for blood pressure and kidney protection 4)    Bayer Low Strength 81 Mg Tbec (Aspirin) .... One tablet by mouth daily 5)    Ventolin Hfa 108 (90 Base) Mcg/act Aers (Albuterol sulfate) .... One puff every 4-6 hours as needed for shortness of breath 6)    Xyzal 5 Mg Tabs (Levocetirizine dihydrochloride) .... One tablet by mouth daily for allergies 7)    Amitiza 8 Mcg Caps (Lubiprostone) .... One capsule by mouth two times a day after food 8)    Protonix 40 Mg Tbec (Pantoprazole sodium) .... One tablet by mouth daily 9)    Zofran 8 Mg Tabs (Ondansetron hcl) .... Rx in er 10)    Hydrocodone-acetaminophen 5-325 Mg Tabs (Hydrocodone-acetaminophen) .... Rx given in er  If you have any questions, please call. We appreciate being able to work with you.   Sincerely,    [facility] Lehman Prom FNP

## 2010-06-22 NOTE — Progress Notes (Signed)
Summary: [facility] lab missed, need orders  Phone Note Call from Patient Call back at [phone]   Summary of Call: need to [facility] lab, also recent ED visit for gallstone Initial call taken by: Ernestine Mcmurray,  June 14, 2010 10:11 AM  Follow-up for Phone Call        Left message on voicemail for pt. to return call.  No labs currently scheduled -- still needs lipids to be done.  ED records on your desk.  [name]  June 14, 2010 12:03 PM   Additional Follow-up for Phone Call Additional follow up Details #1::        reviewed er note - thanks. looks like pt was referred to [place] GI (dr Juanda Chance) pt just  needs lipids - ok to schedule fasting lab visit when pt feels better Additional Follow-up by: Lehman Prom FNP,  June 14, 2010 2:24 PM  New Problems: CHOLELITHIASIS [phone])   Additional Follow-up for Phone Call Additional follow up Details #2::    MS Bethea CAME BY BECASUE SHE WAS SEEN AT [facility] TODAY FOR ABDOMINAL PAIN AND WAS TOLD BY THEM THAT SHE NEEDS TO BE REFERRED TO [facility]. SHE WAS DIAGNOISED WITH  CHOLELITHIASIS.   [name],  June 14, 2010 4:43 PM  This was in a new note -- does she need an appointment here before referral to [facility] surgery?  [name]  June 14, 2010 5:04 PM  see new phone note referral done n.[email or web address],fnp June 15, 2010  2:38 PM    New Problems: CHOLELITHIASIS (ICD-574.20) New/Updated Medications: PROTONIX 40 MG TBEC (PANTOPRAZOLE SODIUM) One tablet by mouth daily ZOFRAN 8 MG TABS (ONDANSETRON HCL) Rx in ER HYDROCODONE-ACETAMINOPHEN 5-325 MG TABS (HYDROCODONE-ACETAMINOPHEN) Rx given in ER   CT Abdomen/Pelvis  Procedure date:  06/13/2010  Findings:      cholelithiasis. gallbladder is not distended small collapsing cyst on left ovary   CT Abdomen/Pelvis  Procedure date:  06/13/2010  Findings:      cholelithiasis. gallbladder is not distended small collapsing cyst on  left ovary

## 2010-07-01 NOTE — Letter (Signed)
Summary: REFERRAL/SURGICAL   REFERRAL/SURGICAL   Imported By: Arta Bruce 06/24/2010 15:52:48  _____________________________________________________________________  External Attachment:    Type:   Image     Comment:   External Document

## 2010-07-06 NOTE — Letter (Signed)
Summary: REFERRAL SURGICAL //APPT DATE & TIME  REFERRAL SURGICAL //APPT DATE & TIME   Imported By: Arta Bruce 06/28/2010 13:52:44  _____________________________________________________________________  External Attachment:    Type:   Image     Comment:   External Document

## 2010-07-07 ENCOUNTER — Other Ambulatory Visit: Payer: Self-pay | Admitting: General Surgery

## 2010-07-07 ENCOUNTER — Ambulatory Visit (HOSPITAL_COMMUNITY)
Admission: RE | Admit: 2010-07-07 | Discharge: 2010-07-07 | Disposition: A | Discharge status: Home / Self Care | Payer: Self-pay | Source: Ambulatory Visit | Attending: General Surgery | Admitting: General Surgery

## 2010-07-07 ENCOUNTER — Encounter (HOSPITAL_COMMUNITY): Payer: Self-pay

## 2010-07-07 ENCOUNTER — Other Ambulatory Visit (HOSPITAL_COMMUNITY): Payer: Self-pay | Admitting: General Surgery

## 2010-07-07 DIAGNOSIS — K8020 Calculus of gallbladder without cholecystitis without obstruction: Secondary | ICD-10-CM | POA: Insufficient documentation

## 2010-07-07 DIAGNOSIS — Z01818 Encounter for other preprocedural examination: Secondary | ICD-10-CM | POA: Insufficient documentation

## 2010-07-07 DIAGNOSIS — Z01812 Encounter for preprocedural laboratory examination: Secondary | ICD-10-CM | POA: Insufficient documentation

## 2010-07-07 DIAGNOSIS — Z01810 Encounter for preprocedural cardiovascular examination: Secondary | ICD-10-CM | POA: Insufficient documentation

## 2010-07-07 LAB — COMPREHENSIVE METABOLIC PANEL
ALT: 22 U/L (ref 0–35)
Alkaline Phosphatase: 88 U/L (ref 39–117)
BUN: 9 mg/dL (ref 6–23)
CO2: 23 mEq/L (ref 19–32)
Chloride: 106 mEq/L (ref 96–112)
Glucose, Bld: 102 mg/dL — ABNORMAL HIGH (ref 70–99)
Potassium: 3.6 mEq/L (ref 3.5–5.1)
Sodium: 136 mEq/L (ref 135–145)
Total Bilirubin: 0.3 mg/dL (ref 0.3–1.2)

## 2010-07-07 LAB — CBC
HCT: 35.7 % — ABNORMAL LOW (ref 36.0–46.0)
Hemoglobin: 11.7 g/dL — ABNORMAL LOW (ref 12.0–15.0)
MCH: 28.8 pg (ref 26.0–34.0)
MCV: 87.9 fL (ref 78.0–100.0)
RBC: 4.06 MIL/uL (ref 3.87–5.11)

## 2010-07-07 LAB — DIFFERENTIAL
Eosinophils Absolute: 0.6 10*3/uL (ref 0.0–0.7)
Lymphocytes Relative: 42 % (ref 12–46)
Lymphs Abs: 2.7 10*3/uL (ref 0.7–4.0)
Monocytes Relative: 8 % (ref 3–12)
Neutrophils Relative %: 41 % — ABNORMAL LOW (ref 43–77)

## 2010-07-15 ENCOUNTER — Other Ambulatory Visit: Payer: Self-pay | Admitting: General Surgery

## 2010-07-15 ENCOUNTER — Ambulatory Visit (HOSPITAL_COMMUNITY)
Admission: RE | Admit: 2010-07-15 | Discharge: 2010-07-15 | Disposition: A | Discharge status: Home / Self Care | Payer: Self-pay | Source: Ambulatory Visit | Attending: General Surgery | Admitting: General Surgery

## 2010-07-15 ENCOUNTER — Ambulatory Visit (HOSPITAL_COMMUNITY): Payer: Self-pay

## 2010-07-15 DIAGNOSIS — E669 Obesity, unspecified: Secondary | ICD-10-CM | POA: Insufficient documentation

## 2010-07-15 DIAGNOSIS — I10 Essential (primary) hypertension: Secondary | ICD-10-CM | POA: Insufficient documentation

## 2010-07-15 DIAGNOSIS — K8010 Calculus of gallbladder with chronic cholecystitis without obstruction: Secondary | ICD-10-CM | POA: Insufficient documentation

## 2010-07-15 DIAGNOSIS — E119 Type 2 diabetes mellitus without complications: Secondary | ICD-10-CM | POA: Insufficient documentation

## 2010-07-15 DIAGNOSIS — Z01812 Encounter for preprocedural laboratory examination: Secondary | ICD-10-CM | POA: Insufficient documentation

## 2010-07-15 LAB — GLUCOSE, CAPILLARY
Glucose-Capillary: 139 mg/dL — ABNORMAL HIGH (ref 70–99)
Glucose-Capillary: 167 mg/dL — ABNORMAL HIGH (ref 70–99)

## 2010-07-18 ENCOUNTER — Observation Stay (HOSPITAL_COMMUNITY)
Admission: EM | Admit: 2010-07-18 | Discharge: 2010-07-19 | Disposition: A | Discharge status: Home / Self Care | Payer: Self-pay | Attending: Internal Medicine | Admitting: Internal Medicine

## 2010-07-18 DIAGNOSIS — R0789 Other chest pain: Principal | ICD-10-CM | POA: Insufficient documentation

## 2010-07-18 DIAGNOSIS — I10 Essential (primary) hypertension: Secondary | ICD-10-CM | POA: Insufficient documentation

## 2010-07-18 DIAGNOSIS — E119 Type 2 diabetes mellitus without complications: Secondary | ICD-10-CM | POA: Insufficient documentation

## 2010-07-18 DIAGNOSIS — Z9089 Acquired absence of other organs: Secondary | ICD-10-CM | POA: Insufficient documentation

## 2010-07-18 DIAGNOSIS — J309 Allergic rhinitis, unspecified: Secondary | ICD-10-CM | POA: Insufficient documentation

## 2010-07-19 ENCOUNTER — Encounter (HOSPITAL_COMMUNITY): Payer: Self-pay

## 2010-07-19 ENCOUNTER — Emergency Department (HOSPITAL_COMMUNITY): Payer: Self-pay

## 2010-07-19 LAB — BASIC METABOLIC PANEL WITH GFR
BUN: 8 mg/dL (ref 6–23)
CO2: 24 meq/L (ref 19–32)
Calcium: 9.1 mg/dL (ref 8.4–10.5)
Chloride: 106 meq/L (ref 96–112)
Creatinine, Ser: 0.42 mg/dL (ref 0.4–1.2)
GFR calc non Af Amer: >60 mL/min
Glucose, Bld: 153 mg/dL — ABNORMAL HIGH (ref 70–99)
Potassium: 3.7 meq/L (ref 3.5–5.1)
Sodium: 136 meq/L (ref 135–145)

## 2010-07-19 LAB — CBC
HCT: 33.1 % — ABNORMAL LOW (ref 36.0–46.0)
Hemoglobin: 11.5 g/dL — ABNORMAL LOW (ref 12.0–15.0)
MCHC: 34.7 g/dL (ref 30.0–36.0)
MCV: 84.9 fL (ref 78.0–100.0)
RDW: 12.4 % (ref 11.5–15.5)
WBC: 7.1 10*3/uL (ref 4.0–10.5)

## 2010-07-19 LAB — DIFFERENTIAL
Basophils Absolute: 0 K/uL (ref 0.0–0.1)
Basophils Relative: 0 % (ref 0–1)
Eosinophils Absolute: 0.4 K/uL (ref 0.0–0.7)
Eosinophils Relative: 5 % (ref 0–5)
Lymphocytes Relative: 29 % (ref 12–46)
Lymphs Abs: 2.1 K/uL (ref 0.7–4.0)
Monocytes Absolute: 0.6 K/uL (ref 0.1–1.0)
Monocytes Relative: 8 % (ref 3–12)
Neutro Abs: 4.1 K/uL (ref 1.7–7.7)
Neutrophils Relative %: 58 % (ref 43–77)

## 2010-07-19 LAB — LIPID PANEL
Cholesterol: 129 mg/dL (ref 0–200)
Total CHOL/HDL Ratio: 3.3 RATIO
VLDL: 13 mg/dL (ref 0–40)

## 2010-07-19 LAB — GLUCOSE, CAPILLARY: Glucose-Capillary: 110 mg/dL — ABNORMAL HIGH (ref 70–99)

## 2010-07-19 LAB — CARDIAC PANEL(CRET KIN+CKTOT+MB+TROPI)
CK, MB: 0.8 ng/mL (ref 0.3–4.0)
Relative Index: INVALID (ref 0.0–2.5)
Total CK: 36 U/L (ref 7–177)
Total CK: 36 U/L (ref 7–177)

## 2010-07-19 LAB — POCT CARDIAC MARKERS
CKMB, poc: <1 ng/mL — ABNORMAL LOW (ref 1.0–8.0)
Myoglobin, poc: 29.8 ng/mL (ref 12–200)

## 2010-07-19 NOTE — Op Note (Signed)
NAMEWILLIAM, [name]          ACCOUNT NO.:  [id number]  MEDICAL RECORD NO.:  [id number]           PATIENT TYPE:  O  LOCATION:  DAYL                         FACILITY:  [facility]  PHYSICIAN:  Anselm Pancoast. Kareemah Grounds, M.D.DATE OF BIRTH:  07-01-1960  DATE OF PROCEDURE:  07/15/2010 DATE OF DISCHARGE:  07/15/2010                              OPERATIVE REPORT   PREOPERATIVE DIAGNOSIS:  Chronic cholecystitis with stones.  POSTOPERATIVE DIAGNOSIS:  Chronic cholecystitis with stones.  OPERATIONS:  Laparoscopic cholecystectomy with cholangiogram.  ANESTHESIA:  General anesthesia.  SURGEON:  Anselm Pancoast. Zachery Dakins, M.D.  ASSISTANT:  Nurse.  HISTORY:  [name] is a 50 year old mother of 5 who was referred by [facility] for symptomatic gallstones.  She is an adult-onset diabetic, is on metformin 500 mg b.i.d. and also on lisinopril for mild high blood pressure.  She was seen in the office recently, has not had any bad episodes of pain since she was seen in the office and presents today for the planned procedure.  DESCRIPTION OF PROCEDURE:  She was given 3 g of Unasyn.  She had PAS stockings and the patient was induced with general anesthesia.  Dr. Okey Dupre was the anesthesiologist.  The oral tube was placed into the stomach and then the abdomen was prepped with Betadine solution and draped in sterile manner.  A small incision was made below the umbilicus after time-out had been completed and the patient has a little weakness at the umbilicus and I put a pursestring in the peritoneum and fascia that was later tied but I also repaired the little umbilical hernia at the completion of surgery.  The Hassan cannula was introduced after the pursestring.  The gallbladder was fairly large in size with some adhesions around it and the proximal portion of the gallbladder was carefully dissected, opened up the peritoneum and the stone that was in the proximal portion of gallbladder was pushed back  and then a clip was placed across the cystic duct and gallbladder junction.  The cystic artery was identified, doubly clipped proximally, singly distally and divided, and then the [facility] catheter was introduced just proximal to the clip.  Cholangiogram obtained shows good prompt filling of extrahepatic biliary system, good flow into the duodenum and the intrahepatic radicles visualized.  The catheter was removed.  Three clips were placed across the cystic duct and then it was divided and then the gallbladder was freed from its bed using predominantly hook electrocautery.  Good hemostasis was obtained.  The gallbladder was placed in the Endo catch bag and then the camera was switched to the upper 10 mm port and the bag containing the gallbladder grasped and brought out through the umbilicus.  The irrigating fluid had been aspirated and with the camera in the upper 10 mm port, I switched the cameras to a 30 degree so I could look upward and then freed the fascia superiorly and inferiorly so I could suture the umbilical hernia with a running 0 Prolene.  The 2 ends were taken back and tied together.  I then tied the pursestring of 0 Vicryl and then the 5 mm port withdrawn under direct vision and then the  subcutaneous wounds were closed with 4-0 Vicryl and then the skin was closed with Benzoin and Steri-Strips.  The patient tolerated the procedure nicely.  I had anesthetized the fascia at the umbilicus at the completion of the closure under direct vision and about 20 cc of the Marcaine with adrenaline was used.  The patient tolerated the procedure nicely and she should be able to go home today.  I will see her back in office in approximately 2 weeks. This was class 2 wound case, no additional bile was spilled in removing of the gallbladder.  The patient will receive Vicodin for postoperative pain, continue all her chronic medications and see [place] in the office in approximately 2  weeks.     Anselm Pancoast. Zachery Dakins, M.D.     WJW/MEDQ  D:  07/15/2010  T:  07/16/2010  Job:  [id number]  Electronically Signed by Consuello Bossier M.D. on 07/19/2010 08:57:02 AM

## 2010-07-27 NOTE — Discharge Summary (Signed)
NAMEHONEY, [name]          ACCOUNT NO.:  [id number]  MEDICAL RECORD NO.:  [id number]           PATIENT TYPE:  I  LOCATION:  1405                         FACILITY:  [facility]  PHYSICIAN:  Hillery Aldo, M.D.   DATE OF BIRTH:  03-22-1961  DATE OF ADMISSION:  07/18/2010 DATE OF DISCHARGE:  07/19/2010                              DISCHARGE SUMMARY   PRIMARY CARE PHYSICIAN:  Dr. Daphine Deutscher at [facility].  CARDIOLOGIST:  The patient is being referred to [facility] for an outpatient stress test.  DISCHARGE DIAGNOSES: 1. Atypical chest pain in the setting of recent cholecystectomy. 2. Type 2 diabetes. 3. Hypertension. 4. Allergic rhinitis.  DISCHARGE MEDICATIONS: 1. Aspirin 81 mg p.o. daily. 2. Glucotrol XL 10 mg p.o. daily. 3. Lisinopril 5 mg p.o. daily. 4. Metformin 500 mg p.o. daily. 5. Nasacort AQ 1 spray intranasally daily. 6. Protonix 40 mg p.o. daily. 7. Xyzal 5 mg p.o. daily.  CONSULTATIONS:  Outpatient consultation with [facility] for stress testing.  BRIEF ADMISSION HPI:  The patient is a 50 year old female who presented to the hospital with chief complaint of chest tightness, nonexertional, in the setting of having recently had a cholecystectomy on July 15, 2010.  The patient did have some associated shortness of breath and nausea but denied any associated syncope, palpitations, diaphoresis, or lower extremity edema.  The patient did not have any significant EKG changes and initial cardiac markers were negative, however, she was referred to the hospitalist service for further evaluation.  For the full details, please see the dictated report done by Dr. Pearson Grippe.  PROCEDURES AND DIAGNOSTIC STUDIES: 1. Chest x-ray on July 19, 2010, showed no acute cardiopulmonary     disease. 2. A 12-lead EKG initially showed first-degree AV block with     ventricular rate of 64 beats per minute and nonspecific T-wave     abnormalities.  Repeat EKG showed  normal sinus rhythm at 67 beats     per minute with nonspecific T-wave abnormalities.  DISCHARGE LABORATORY VALUES:  Cardiac markers are negative to date. Sodium is 136, potassium 3.7, chloride 106, bicarb 24, BUN 8, creatinine 0.42, glucose 153, calcium 9.1.  White blood cell count 7.1, hemoglobin 11.5, hematocrit 33.1, platelets 210.  HOSPITAL COURSE BY PROBLEM: 1. Atypical chest pain:  The patient's chest pain is nonexertional.     She has no family history of early cardiac disease.  She does have     some risk factors including diabetes and hypertension but she is     not menopausal and her risk of having significant coronary disease     is therefore quite low.  At this point, she indicates that her     cholesterol has been checked at [facility] and has never been high     but we will check a fasting lipid panel to further risk stratify     her to ensure this is true.  If her next set of cardiac markers     remains negative, we will discharge her home with outpatient     followup with [facility] for outpatient stress testing. 2. Recent cholecystectomy:  The patient's pain is  actually more     located in the left shoulder and she reports that since her     cholecystectomy it has improved.  I suspect that she is having     referred pain from her recent surgery.  Additionally, she is not     hypoxic or tachycardic to suggest underlying pulmonary embolism.     Routine pain control measures should adequately control her pain. 3. Type 2 diabetes:  The patient was put on sliding scale insulin but     can safely resume her metformin and Glucotrol.  We will also check     a hemoglobin A1c to determine her outpatient control but she does     not need to stay for these results.  She can follow up with her     primary care physician to ensure good diabetes control. 4. Hypertension:  It is controlled on her ACE inhibitor which we will     resume. 5. Allergic rhinitis:  The patient  will continue on Xyzal and     Nasacort.  DISPOSITION:  The patient is medically stable and if her  next set of cardiac markers are negative, she will be discharged home.  [facility] has been consulted for outpatient stress testing and they will call her tomorrow with an appointment time.  DISCHARGE DIET:  Heart healthy, carbohydrate modified  CONDITION ON DISCHARGE:  Stable.  Time spent coordinating care for discharge and discharge instructions including face-to-face time equals approximately 35 minutes.     Hillery Aldo, M.D.     CR/MEDQ  D:  07/19/2010  T:  07/19/2010  Job:  [id number]  cc:   Dr. Daphine Deutscher at [facility]  Electronically Signed by Hillery Aldo M.D. on 07/27/2010 04:03:11 PM

## 2010-08-04 NOTE — H&P (Signed)
[name], [name]          ACCOUNT NO.:  [id number]  MEDICAL RECORD NO.:  [id number]           PATIENT TYPE:  I  LOCATION:  1405                         FACILITY:  [facility]  PHYSICIAN:  Massie Maroon, MD        DATE OF BIRTH:  Apr 09, 1960  DATE OF ADMISSION:  07/18/2010 DATE OF DISCHARGE:                             HISTORY & PHYSICAL   CHIEF COMPLAINT:  Chest tightness.  HISTORY OF PRESENT ILLNESS:  A 50 year old female with complaints of left-sided chest pain which she describes as tightness, starting about 9:30 last night.  It was associated with shortness of breath, nausea, and vomiting, but she denies any syncope, palpitations, diaphoresis, heartburn, lower extremity edema.  The patient presented to the ED because of chest pain for evaluation.  EKG showed normal sinus rhythm at 65, first-degree AV block, normal axis, early R-wave progression, no ST, T segment changes, consistent with acute ischemia.  Chest x-ray showed no evidence of any acute process.  The patient, of note, recently had gallbladder removal via laparoscopic cholecystectomy.  Note, the cardiac markers were negative.  The patient will be admitted for workup of her chest pain.  PAST MEDICAL HISTORY: 1. Type 2 diabetes. 2. Hypertension. 3. Allergic rhinitis.  PAST SURGICAL HISTORY: 1. Cholecystectomy. 2. C-section.  SOCIAL HISTORY:  The patient does not smoke or drink.  She is married.  FAMILY HISTORY:  There is no family history of heart disease.  ALLERGIES:  No known drug allergies.  MEDICATIONS:  Glucotrol, lisinopril, metformin, oxycodone, Xyzal.  REVIEW OF SYSTEMS:  Negative for all 10 organ systems except for pertinent positives stated above.  PHYSICAL EXAMINATION:  VITAL SIGNS:  Temperature 98.2, pulse 68, blood pressure 136/93, pulse oximetry 95% on room air. HEENT:  Anicteric. NECK:  No JVD. HEART:  Regular rate and rhythm.  S1 and S2.  No murmurs, gallops, or rubs. LUNGS:  Clear to  auscultation bilaterally. ABDOMEN:  Soft, nontender, nondistended.  Positive bowel sounds. EXTREMITIES:  No cyanosis, clubbing, or edema. SKIN:  No rashes. LYMPH NODES:  No adenopathy. NEUROLOGIC:  Nonfocal.  LABORATORY DATA:  WBC 7.1, hemoglobin 11.5, platelet count 210. Troponin-I less than 1.0.  Sodium 136, potassium 3.7, BUN 8, creatinine 0.42.  ASSESSMENT AND PLAN: 1. Chest pain:  The patient placed on Telemetry.  We will check CK, CK-     MB, troponin I q.6 h. x3 sets.  If cardiac markers are negative,     then consider stress testing. 2. Diabetes type 2.  Continue metformin, unknown dose.  Please find     out the doses of her medications in the morning.  Check fingerstick     blood sugars a.c. and h.s.  NovoLog sensitive sliding scale. 3. Hypertension, uncontrolled.  The patient's blood pressure was     mildly elevated when she presented to the ED.  The patient will be     started on carvedilol 3.125 mg p.o. b.i.d. and started on aspirin     as well as Crestor. 4. Deep vein thrombosis prophylaxis.  SCDs.     Massie Maroon, MD     JYK/MEDQ  D:  07/19/2010  T:  07/19/2010  Job:  [id number]  Electronically Signed by Pearson Grippe MD on 08/04/2010 11:31:53 PM

## 2010-08-20 NOTE — Discharge Summary (Signed)
   NAMESERAPHIM, AFFINITO                    ACCOUNT NO.:  [id number]   MEDICAL RECORD NO.:  [id number]                   PATIENT TYPE:  INP   LOCATION:  9134                                 FACILITY:  [facility]   PHYSICIAN:  Phil D. Okey Dupre, M.D.                  DATE OF BIRTH:  [birth year]   DATE OF ADMISSION:  12/24/2002  DATE OF DISCHARGE:                                 DISCHARGE SUMMARY   HOSPITAL COURSE:  This patient is a 50 year old [place] female who is G6 P4-  0-1-4 at 38 weeks three who presented with clear spontaneous rupture of  membranes to the maternity admissions unit.  Was admitted for low transverse  C-section secondary to baby that was estimated to be large for gestational  age by ultrasound.  The patient was known to be insulin-dependent diabetic  during gestation and have advanced maternal age.  The patient was A  positive, hepatitis B surface antigen negative, HIV negative, GBS status  unknown.  Fetal heart tracing was reassuring upon admission.  The patient  was contracting every three to four minutes.  The patient was taken to OR.  Dr. Elinor Dodge performed C-section.  Delivered a viable female infant weighing  9 pounds 6 ounces with Apgars of 8 and 9.  The patient had uneventful  postpartum course.  Female infant was circumcised prior to by Dr. Elsie Lincoln.  The patient was given prescription for ibuprofen 600 mg one pill  q.6h. as needed for pain, Percocet 5/325 mg one pill q.4h. as needed for  pain, iron 325 one pill a day for 30 days, prenatal vitamins one pill a day  while breast feeding.  The patient's staples were removed prior to  discharge.  Incision was clean.  The patient was instructed to follow up at  MAU in seven days for an incision check and also to assess capillary blood  glucose, given her borderline high blood sugars during her stay.  Discharge  hemoglobin was 9.3.     Franklyn Lor, MD                            Javier Glazier. Okey Dupre, M.D.    TD/MEDQ  D:   12/27/2002  T:  12/27/2002  Job:  [id number]

## 2010-08-20 NOTE — Op Note (Signed)
[facility]  Patient:    OVAL, MORALEZ Visit Number: [id number] MRN: [id number]          Service Type: DSU Location: [facility] Attending Physician:  Osborn Coho Dictated by:   Janeece Riggers Dareen Piano, M.D. Proc. Date: 06/09/01 Admit Date:  06/09/2001 Discharge Date: 06/09/2001   CC:         Conni Elliot, M.D.   Operative Report  PREOPERATIVE DIAGNOSIS:       Fifteen-week fetal demise.  POSTOPERATIVE DIAGNOSIS:      Fifteen-week fetal demise.  OPERATION:                    Dilation and evacuation.  SURGEON:                      Mark E. Dareen Piano, M.D.  ANESTHESIA:                   MAC and paracervical block.  DRAINS:                       None.  ANTIBIOTICS:                  Ancef 1 g.  SPECIMENS:                    1. Products of conception sent to                                  pathology.                               2. Tissue sent for chromosome                                  analysis.  DESCRIPTION OF PROCEDURE:     The patient was taken to the operating room where she was placed in the dorsal lithotomy position.  She was prepped with Hibiclens and MAC anesthesia was administered.  A sterile speculum was placed in the vagina.  Twenty cc of 1% lidocaine was used for paracervical block. The cervical os was then serially dilated to a 51 [place].  A 14 mm suction cannula was placed in the uterine cavity and products of conception withdrawn.  Sharp curettage was then performed followed by repeat suction.  The patient tolerated the procedure well and she was taken to the recovery room in stable condition.  The patient was given Methergine in the operative suite.  She will be discharged to home with Keflex 500 mg q.i.d. for two days, Methergine 0.2 mg x6 doses and Darvocet to take p.r.n.  She will follow up in the office in four weeks. Dictated by:   Janeece Riggers Dareen Piano, M.D. Attending Physician:  Osborn Coho DD:   06/09/01 TD:  06/11/01 Job: [id number]

## 2010-08-20 NOTE — Op Note (Signed)
[name], GIBBS                    ACCOUNT NO.:  [id number]   MEDICAL RECORD NO.:  [id number]                   PATIENT TYPE:  MAT   LOCATION:  [facility]                                 FACILITY:  [facility]   PHYSICIAN:  Phil D. Okey Dupre, M.D.                  DATE OF BIRTH:  May 19, 1960   DATE OF PROCEDURE:  12/24/2002  DATE OF DISCHARGE:                                 OPERATIVE REPORT   PROCEDURES:  1. Low transverse cesarean section.  2. Bilateral tubal ligation.   PREOPERATIVE DIAGNOSES:  1. Large for gestational age baby.  2. Multiparity.   POSTOPERATIVE DIAGNOSES:  1. Large for gestational age baby.  2. Multiparity.   SURGEON:  Javier Glazier. Okey Dupre, M.D.   PROCEDURE WENT AS FOLLOWS:  Under satisfactory spinal anesthesia with the  patient in dorsal supine position, Foley catheter in the urinary bladder,  the abdomen was prepped and draped in the usual sterile manner, entered  through a Pfannenstiel incision situated 3 cm above the symphysis pubis and  extended for a total length of 18 cm.  The abdomen was entered by layers and  on entering the peritoneal cavity, the visceral peritoneum and the anterior  surface of the uterus was opened transversely by sharp dissection.  The  bladder pushed away from the lower uterine segment was entered by sharp and  blunt dissection from a direct OP presentation.  The baby's head was  delivered.  There was some difficulty with the shoulders and the right  shoulder was able to be delivered by manipulation of the shoulder itself.  Once that was done the baby was easily delivered, the cord doubly clamped,  divided, the baby handed to the pediatrician.  It was a female weighing 9  pounds 6 ounces with an 8/9 Apgar.  The placenta was manually removed.  The  uterus explored and closed with a continuous running-lock 0 Vicryl suture in  an atraumatic needle.  The area was observed for bleeding, none was noted.  Each fallopian tube was grasped in the mid  point with a Babcock clamp and an  opening made in the avascular portion of the broad ligament with a hemostat  through which was brought a 1 plain catgut suture, tied around the distal  and proximal ends of the tube forming a loop above the tie of approximately  2 cm in length.  A second tie placed just below the aforementioned ties.  This was cut short using 1 plain catgut suture and a section of tube above  the ties was excised and sent for pathological diagnosis.  The free ends of  the tube exposed by the dissection of the portion sent for pathological  diagnosis was coagulated with hot cautery.  This area was irrigated,  observed for bleeding, none was noted, and the fascia was closed from either  end of the incision meeting in the mid point with a continuous running  alternating-lock 0 Vicryl suture in an atraumatic needle.  Subcutaneous  bleeders were controlled with hot cautery and skin staples were used for  skin edge approximation.  A dry sterile dressing was applied.  The patient  tolerated the procedure well, was transferred to the recovery room in  satisfactory condition with an 800 mL blood loss.                                              Phil D. Okey Dupre, M.D.   PDR/MEDQ  D:  12/24/2002  T:  12/24/2002  Job:  [id number]

## 2010-08-22 ENCOUNTER — Inpatient Hospital Stay (INDEPENDENT_AMBULATORY_CARE_PROVIDER_SITE_OTHER)
Admission: RE | Admit: 2010-08-22 | Discharge: 2010-08-22 | Disposition: A | Discharge status: Home / Self Care | Payer: Self-pay | Source: Ambulatory Visit | Attending: Emergency Medicine | Admitting: Emergency Medicine

## 2010-08-22 DIAGNOSIS — J309 Allergic rhinitis, unspecified: Secondary | ICD-10-CM

## 2011-01-03 LAB — GLUCOSE, CAPILLARY: Glucose-Capillary: 142 — ABNORMAL HIGH

## 2011-01-12 LAB — GC/CHLAMYDIA PROBE AMP, GENITAL
Chlamydia, DNA Probe: NEGATIVE
GC Probe Amp, Genital: NEGATIVE

## 2012-03-16 IMAGING — CR DG CHEST 2V
2 series · 2 of 2 positions shown · non-contrast
Comparison: Chest x-ray of 09/21/2006

CLINICAL DATA: For cholecystectomy for gallstones

CHEST - 2 VIEW

[w chest pa]
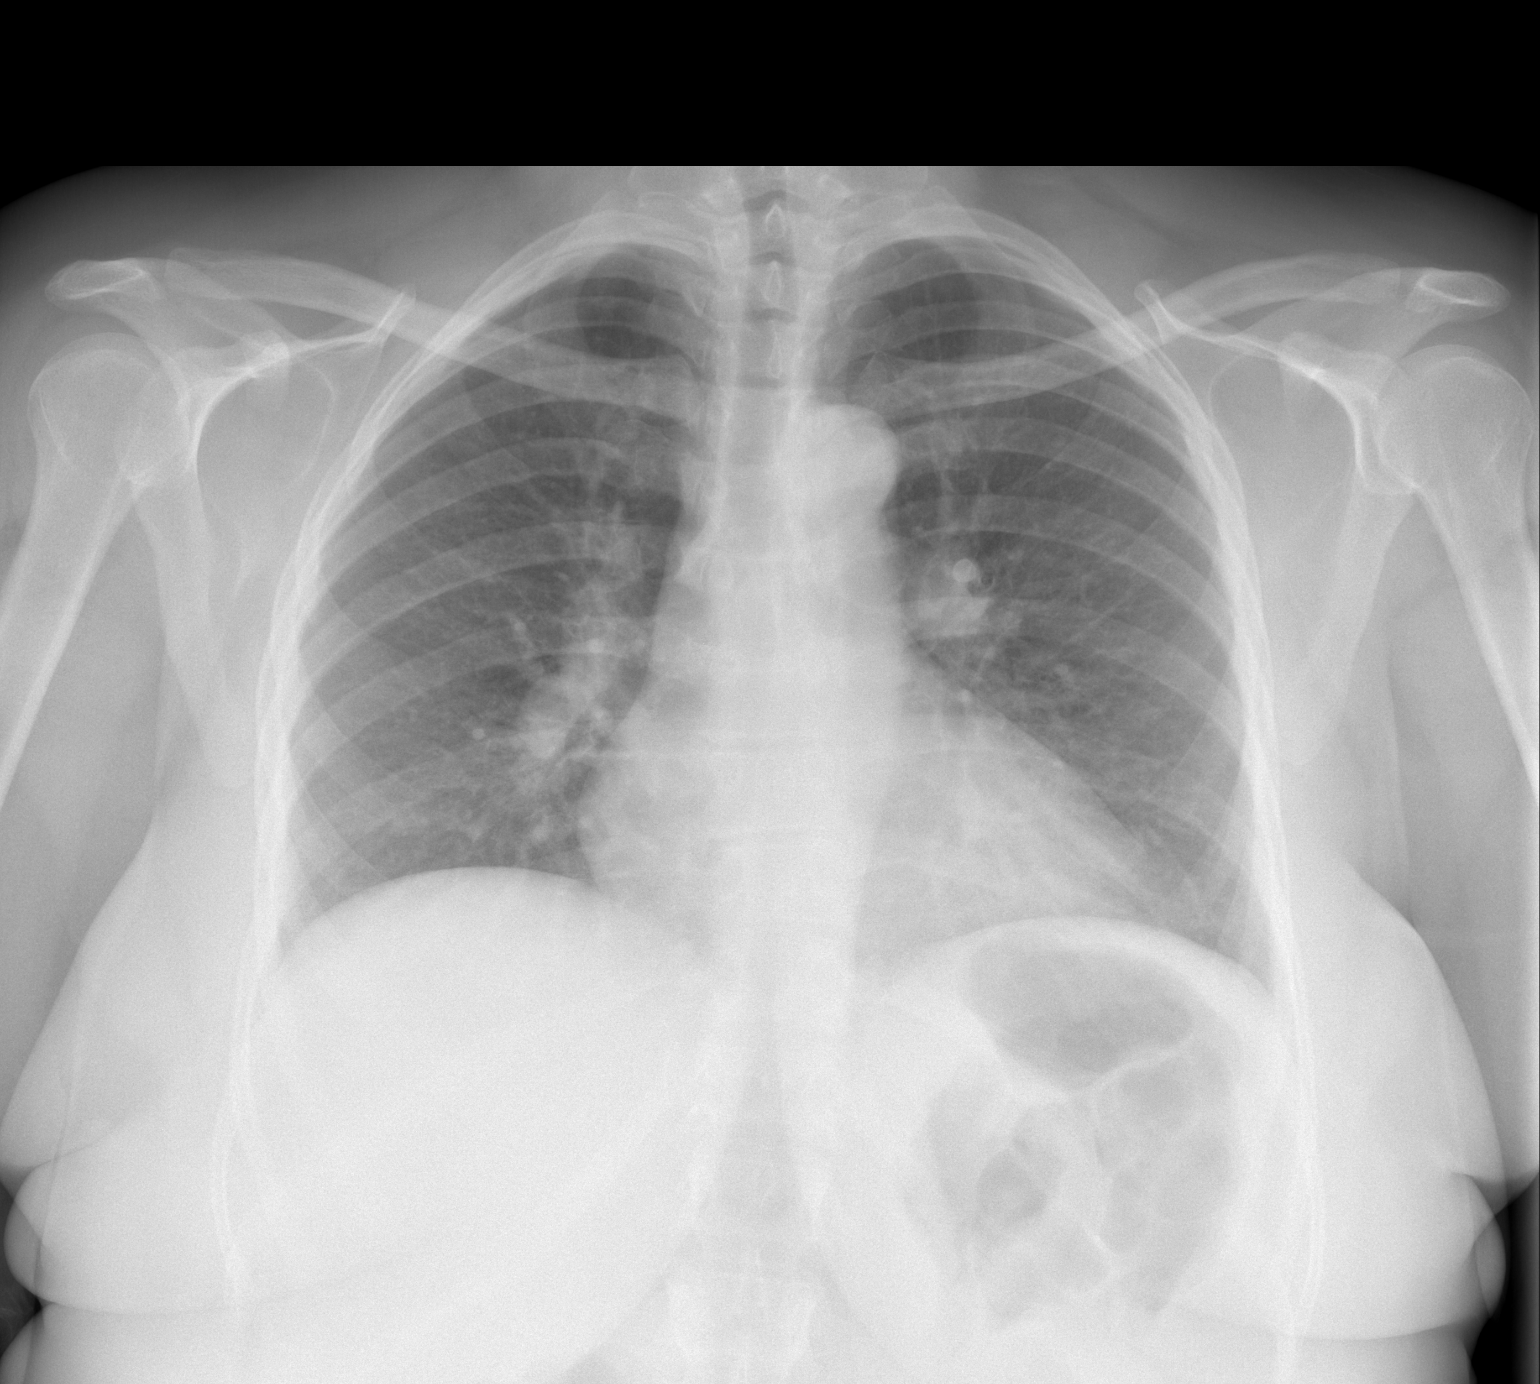

[w chest lat]
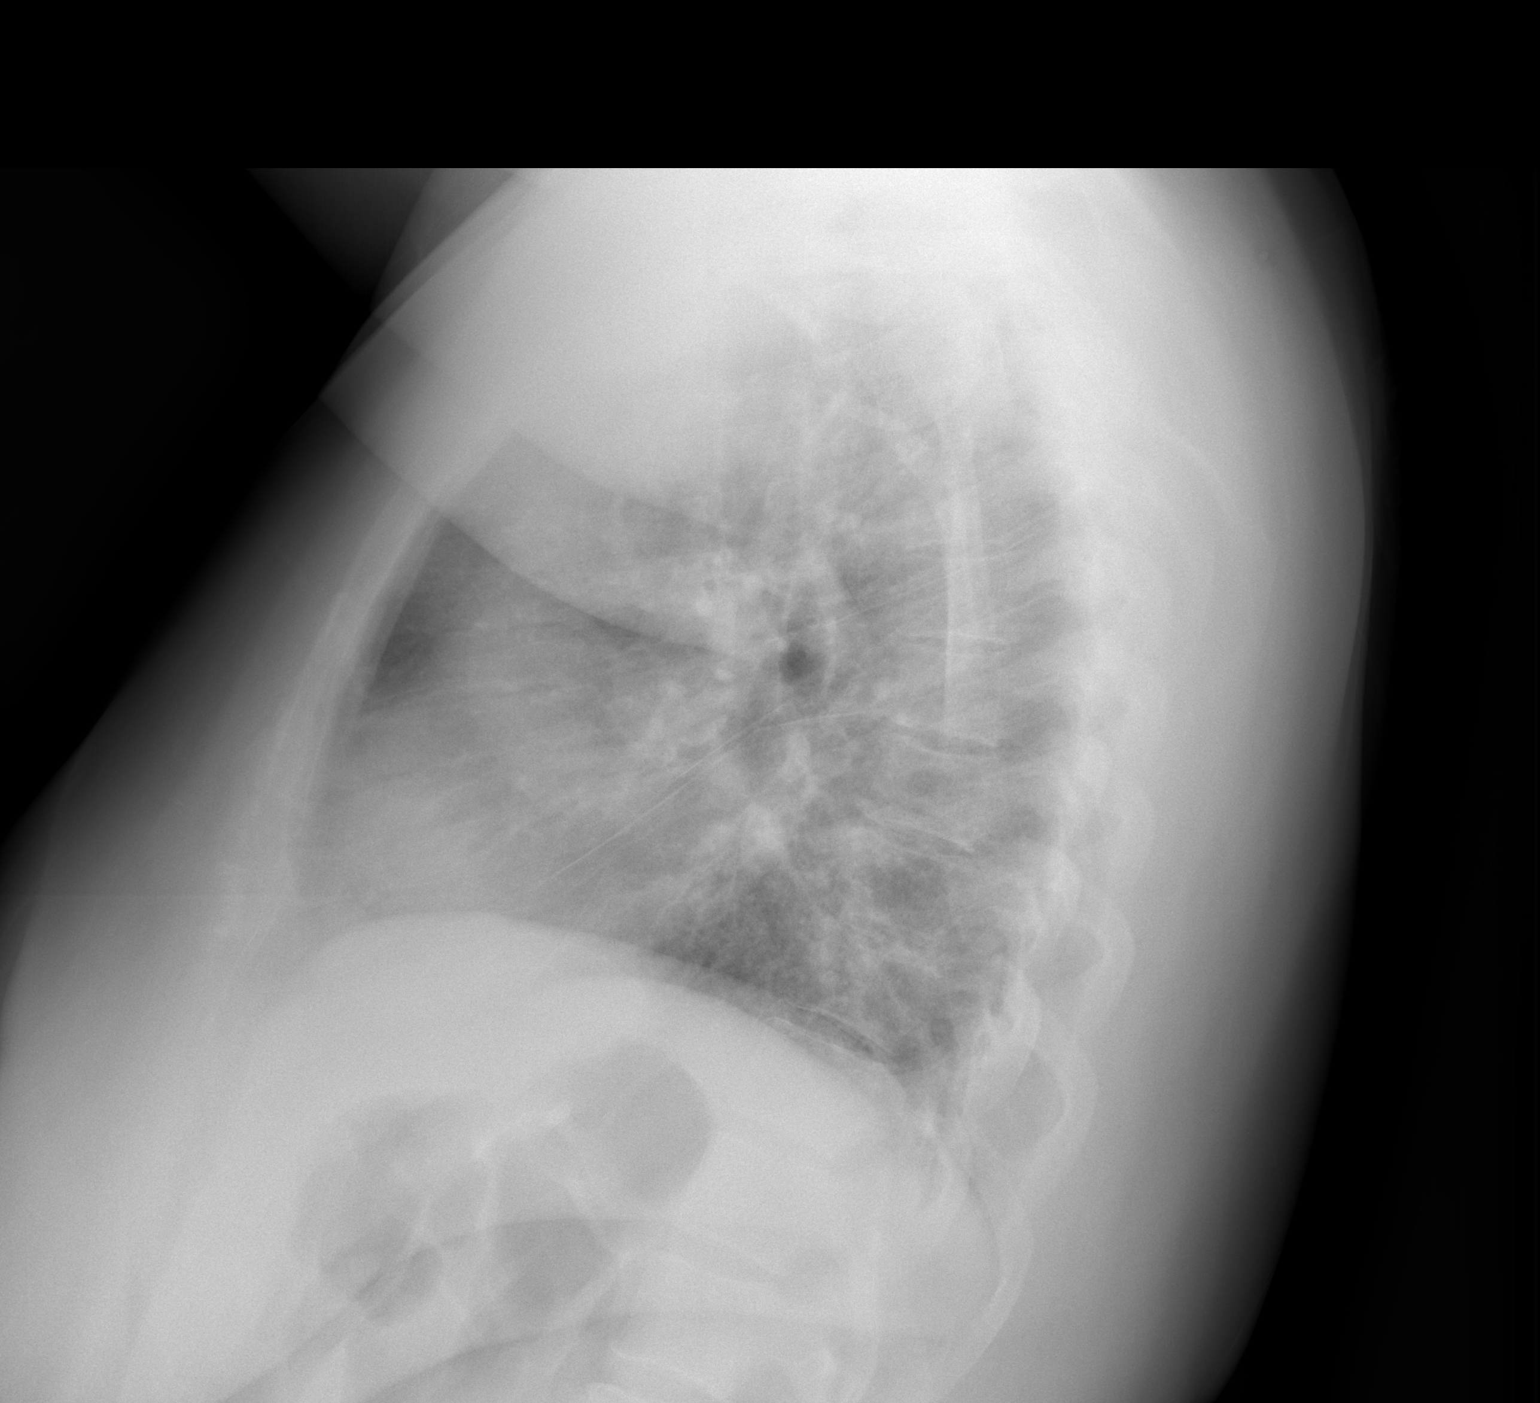

[2 of 2 positions shown; findings below may reference images not displayed]

FINDINGS: The lungs are clear.  Mediastinal contours are stable.
The heart is within upper limits of normal.  No acute bony
abnormality is seen.
IMPRESSION: Stable chest x-ray.  No active lung disease.

## 2022-02-24 ENCOUNTER — Emergency Department (HOSPITAL_COMMUNITY): Payer: No Typology Code available for payment source

## 2022-02-24 ENCOUNTER — Emergency Department (HOSPITAL_COMMUNITY)
Admission: EM | Admit: 2022-02-24 | Discharge: 2022-02-25 | Disposition: A | Discharge status: Home / Self Care | Payer: No Typology Code available for payment source | Attending: Emergency Medicine | Admitting: Emergency Medicine

## 2022-02-24 ENCOUNTER — Other Ambulatory Visit: Payer: Self-pay

## 2022-02-24 DIAGNOSIS — R110 Nausea: Secondary | ICD-10-CM | POA: Diagnosis not present

## 2022-02-24 DIAGNOSIS — Z79899 Other long term (current) drug therapy: Secondary | ICD-10-CM | POA: Insufficient documentation

## 2022-02-24 DIAGNOSIS — R0789 Other chest pain: Secondary | ICD-10-CM | POA: Diagnosis not present

## 2022-02-24 DIAGNOSIS — R000 Tachycardia, unspecified: Secondary | ICD-10-CM | POA: Insufficient documentation

## 2022-02-24 DIAGNOSIS — R079 Chest pain, unspecified: Secondary | ICD-10-CM | POA: Diagnosis present

## 2022-02-24 LAB — COMPREHENSIVE METABOLIC PANEL
ALT: 20 U/L (ref 0–44)
AST: 19 U/L (ref 15–41)
Albumin: 4.1 g/dL (ref 3.5–5.0)
Alkaline Phosphatase: 86 U/L (ref 38–126)
Anion gap: 9 (ref 5–15)
BUN: 13 mg/dL (ref 6–20)
CO2: 22 mmol/L (ref 22–32)
Calcium: 9.7 mg/dL (ref 8.9–10.3)
Chloride: 105 mmol/L (ref 98–111)
Creatinine, Ser: 0.74 mg/dL (ref 0.44–1.00)
GFR, Estimated: >60 mL/min (ref >60)
Glucose, Bld: 245 mg/dL — ABNORMAL HIGH (ref 70–99)
Potassium: 3.5 mmol/L (ref 3.5–5.1)
Sodium: 136 mmol/L (ref 135–145)
Total Bilirubin: 0.3 mg/dL (ref 0.3–1.2)
Total Protein: 8 g/dL (ref 6.5–8.1)

## 2022-02-24 LAB — CBC
HCT: 36.5 % (ref 36.0–46.0)
Hemoglobin: 11.9 g/dL — ABNORMAL LOW (ref 12.0–15.0)
MCH: 29.3 pg (ref 26.0–34.0)
MCHC: 32.6 g/dL (ref 30.0–36.0)
MCV: 89.9 fL (ref 80.0–100.0)
Platelets: 265 10*3/uL (ref 150–400)
RBC: 4.06 MIL/uL (ref 3.87–5.11)
RDW: 12.8 % (ref 11.5–15.5)
WBC: 8.7 10*3/uL (ref 4.0–10.5)
nRBC: 0 % (ref 0.0–0.2)

## 2022-02-24 LAB — LIPASE, BLOOD: Lipase: 49 U/L (ref 11–51)

## 2022-02-24 LAB — TROPONIN I (HIGH SENSITIVITY): Troponin I (High Sensitivity): 3 ng/L (ref <18)

## 2022-02-24 MED ORDER — LIDOCAINE VISCOUS HCL 2 % MT SOLN
15.0000 mL | Freq: Once | OROMUCOSAL | Status: AC
  Administered 2022-02-24: 15 mL via OROMUCOSAL
  Filled 2022-02-24: qty 15

## 2022-02-24 MED ORDER — ALUM & MAG HYDROXIDE-SIMETH 200-200-20 MG/5ML PO SUSP
30.0000 mL | Freq: Once | ORAL | Status: AC
  Administered 2022-02-24: 30 mL via ORAL
  Filled 2022-02-24: qty 30

## 2022-02-24 MED ORDER — DIPHENHYDRAMINE HCL 50 MG/ML IJ SOLN
25.0000 mg | Freq: Once | INTRAMUSCULAR | Status: DC
  Filled 2022-02-24: qty 1

## 2022-02-24 MED ORDER — FAMOTIDINE 20 MG PO TABS
20.0000 mg | ORAL_TABLET | Freq: Once | ORAL | Status: AC
  Administered 2022-02-24: 20 mg via ORAL
  Filled 2022-02-24: qty 1

## 2022-02-24 NOTE — ED Provider Notes (Signed)
[facility] DEPT Provider Note   CSN: [id number] Arrival date & time: 02/24/22  2023     History PMH: HLD, DM, HTN, GERD, s/p cholecystecomy Chief Complaint  Patient presents with   Chest Pain    [name] is a 61 y.o. female.  She is presenting to the ED with chest pain that is been going on since this morning around 10 AM.  She noticed it after she ate breakfast.  She says it is across the part of her chest and into her upper abdomen.  She also has some pain into her back.  She describes it as burning sensation.  She says it is worse after she eats.  She also has associated nausea. She describes feeling burning going up into her neck and back down towards her stomach. She is not on PPI. No history of cardiac disease. She denies any vomiting, shortness of breath, dizziness, numbness or weakness, diarrhea, constipation, fevers, chills, leg swelling, leg pain.   Chest Pain Associated symptoms: abdominal pain, back pain and nausea        Home Medications Prior to Admission medications   Medication Sig Start Date End Date Taking? Authorizing Provider  albuterol (VENTOLIN HFA) 108 (90 Base) MCG/ACT inhaler Inhale 2 puffs into the lungs every 6 (six) hours as needed for wheezing or shortness of breath. 07/05/21  Yes [provider]  atorvastatin (LIPITOR) 20 MG tablet Take by mouth. 08/10/21  Yes [provider]  azelastine (ASTELIN) 0.1 % nasal spray Place 2 sprays into both nostrils 2 (two) times daily. 07/05/21  Yes [provider]  cetirizine (ZYRTEC) 10 MG tablet Take 10 mg by mouth daily as needed for allergies. 12/27/21  Yes [provider]  doxycycline (VIBRAMYCIN) 100 MG capsule Take 100 mg by mouth 2 (two) times daily. 02/20/22 03/02/22 Yes [provider]  fluticasone (FLONASE) 50 MCG/ACT nasal spray Place 1 spray into both nostrils daily as needed for allergies.   Yes [provider]   glimepiride (AMARYL) 4 MG tablet Take 4 mg by mouth daily with breakfast. 02/24/20  Yes [provider]  JANUMET 50-1000 MG tablet TAKE 1 TABLET BY MOUTH TWICE DAILY WITH MEALS FOR DIABETES 08/10/21  Yes [provider]  lisinopril (ZESTRIL) 20 MG tablet Take 20 mg by mouth daily. 11/27/19  Yes [provider]  mupirocin ointment (BACTROBAN) 2 % Apply to the nasal vestibule internally on the left 3 times daily using a Q-tip as shown for 2 weeks.  Repeat 3 times daily for 10 days for recurrence of nasal crusting. 02/22/22  Yes [provider]  omeprazole (PRILOSEC) 20 MG capsule Take 1 capsule (20 mg total) by mouth daily. 02/25/22 03/27/22 Yes Gerell Fortson, Adora Fridge, PA-C      Allergies    Patient has no known allergies.    Review of Systems   Review of Systems  Cardiovascular:  Positive for chest pain.  Gastrointestinal:  Positive for abdominal pain and nausea.  Musculoskeletal:  Positive for back pain.  All other systems reviewed and are negative.   Physical Exam Updated Vital Signs BP (!) 147/97   Pulse 95   Temp 98.6 F (37 C) (Oral)   Resp 18   Ht '5\' 4"'$  (1.626 m)   Wt 79.4 kg   SpO2 92%   BMI 30.04 kg/m  Physical Exam Vitals and nursing note reviewed.  Constitutional:      General: She is not in acute distress.  Appearance: Normal appearance. She is not ill-appearing, toxic-appearing or diaphoretic.  HENT:     Head: Normocephalic and atraumatic.     Nose: No nasal deformity.     Mouth/Throat:     Lips: Pink. No lesions.     Mouth: Mucous membranes are moist. No injury, lacerations, oral lesions or angioedema.     Pharynx: Oropharynx is clear. Uvula midline. No pharyngeal swelling, oropharyngeal exudate, posterior oropharyngeal erythema or uvula swelling.  Eyes:     General: Gaze aligned appropriately. No scleral icterus.       Right eye: No discharge.        Left eye: No discharge.     Conjunctiva/sclera: Conjunctivae normal.      Right eye: Right conjunctiva is not injected. No exudate or hemorrhage.    Left eye: Left conjunctiva is not injected. No exudate or hemorrhage. Neck:     Vascular: No JVD.  Cardiovascular:     Rate and Rhythm: Normal rate and regular rhythm.     Pulses: Normal pulses.          Radial pulses are 2+ on the right side and 2+ on the left side.       Dorsalis pedis pulses are 2+ on the right side and 2+ on the left side.     Heart sounds: Normal heart sounds, S1 normal and S2 normal. Heart sounds not distant. No murmur heard.    No friction rub. No gallop. No S3 or S4 sounds.  Pulmonary:     Effort: Pulmonary effort is normal. No tachypnea, accessory muscle usage or respiratory distress.     Breath sounds: Normal breath sounds. No stridor. No decreased breath sounds, wheezing, rhonchi or rales.  Chest:     Chest wall: No tenderness.  Abdominal:     General: Abdomen is flat. Bowel sounds are normal. There is no distension.     Palpations: Abdomen is soft. There is no mass or pulsatile mass.     Tenderness: There is no abdominal tenderness. There is no guarding or rebound.  Musculoskeletal:     Right lower leg: No edema.     Left lower leg: No edema.  Skin:    General: Skin is warm and dry.     Coloration: Skin is not jaundiced or pale.     Findings: No bruising, erythema, lesion or rash.  Neurological:     General: No focal deficit present.     Mental Status: She is alert and oriented to person, place, and time.     GCS: GCS eye subscore is 4. GCS verbal subscore is 5. GCS motor subscore is 6.  Psychiatric:        Mood and Affect: Mood normal.        Behavior: Behavior normal. Behavior is cooperative.     ED Results / Procedures / Treatments   Labs (all labs ordered are listed, but only abnormal results are displayed) Labs Reviewed  CBC - Abnormal; Notable for the following components:      Result Value   Hemoglobin 11.9 (*)    All other components within normal limits   COMPREHENSIVE METABOLIC PANEL - Abnormal; Notable for the following components:   Glucose, Bld 245 (*)    All other components within normal limits  LIPASE, BLOOD  TROPONIN I (HIGH SENSITIVITY)  TROPONIN I (HIGH SENSITIVITY)    EKG EKG Interpretation  Date/Time:  Thursday February 24 2022 20:39:51 EST Ventricular Rate:  105 PR Interval:  198 QRS  Duration: 86 QT Interval:  336 QTC Calculation: 444 R Axis:   -33 Text Interpretation: Sinus tachycardia Borderline prolonged PR interval Left ventricular hypertrophy Baseline wander in lead(s) III Confirmed by Randal Buba, April ([id number]) on 02/25/2022 12:24:50 AM  Radiology DG Chest 2 View  Result Date: 02/24/2022 CLINICAL DATA:  Chest pain. EXAM: CHEST - 2 VIEW COMPARISON:  07/19/2010. FINDINGS: The heart size and mediastinal contours are within normal limits. Both lungs are clear. No acute osseous abnormality. IMPRESSION: No active cardiopulmonary disease. Electronically Signed   By: Brett Fairy M.D.   On: 02/24/2022 21:09    Procedures Procedures  This patient was on telemetry or cardiac monitoring during their time in the ED.    Medications Ordered in ED Medications  diphenhydrAMINE (BENADRYL) injection 25 mg (25 mg Intravenous Patient Refused/Not Given 02/24/22 2316)  alum & mag hydroxide-simeth (MAALOX/MYLANTA) 200-200-20 MG/5ML suspension 30 mL (30 mLs Oral Given 02/24/22 2256)  lidocaine (XYLOCAINE) 2 % viscous mouth solution 15 mL (15 mLs Mouth/Throat Given 02/24/22 2256)  famotidine (PEPCID) tablet 20 mg (20 mg Oral Given 02/24/22 2255)    ED Course/ Medical Decision Making/ A&P                           Medical Decision Making Risk OTC drugs. Prescription drug management.    MDM  This is a 61 y.o. female who presents to the ED with chest pain The differential of this patient includes but is not limited to ACS, Aortic Dissection, PE, GERD, PUD, Pancreatitis, biliary pathology, etc  Initial Impression  Well  appearing, no acute distress Afebrile, Elevated BP to 172/77, vitals otherwise stable Labs and initial imaging were ordered in triage. GI cocktail provided due to suspicion for gastric etiology  I personally ordered, reviewed, and interpreted all laboratory work and imaging and agree with radiologist interpretation. Results interpreted below: CBC with stable hemoglobin, normal white blood cell count.  CMP reveals glucose of 245 without any acidosis or anion gap changes, normal LFTs, normal renal function, normal electrolytes.  She had 2 troponins taken that were both negative.  She had a lipase of 49.  Chest x-ray does not reveal any pneumothorax, pneumonia, or other acute abnormality.  EKG reveals mild ST without ischemic changes.  Assessment/Plan:  Patient had overall unremarkable workup.  I have low suspicion for ACS given negative troponins, nonischemic EKG. HEART score 2 which makes her low risk for MACE. I also has low suspicion for PE.  She initially did have some minimal tachycardia but this resolved without any intervention shortly after first value was taken, and this has not returned.  She has a 0 Wells score, and PERC negative.  I doubt this is an aortic dissection.  I have low suspicion for biliary pathology with normal LFTs, negative Murphy sign, and history of cholecystectomy.  She was given GI cocktail here in the ED which has resolved her symptoms today.  Presentation seems most consistent with GI cause. I have prescribed a PPI for patient. Plan to have her f/u with her PCP in [place]. She is having strict return precautions with any new or worsening symptoms    Charting Requirements Additional history is obtained from:  Independent historian External Records from outside source obtained and reviewed including: Prior EKG, Prior admission note from 2012 Social Determinants of Health:   From out of town [place] PMH that complicates patient's illness: GERD, HTN  Patient  Care Problems that were addressed during  this visit: - Atypical Chest Pain: Acute illness with complication This patient was maintained on a cardiac monitor/telemetry. I personally viewed and interpreted the cardiac monitor which reveals an underlying rhythm of NSR/ST Medications given in ED: GI cocktail Reevaluation of the patient after these medicines showed that the patient resolved I have reviewed home medications and made changes accordingly.  Critical Care Interventions: n/a Consultations: n/a Disposition: discharge  This is a supervised visit with my attending physician, Dr. Randal Buba. We have discussed this patient and they have altered the plan as needed.  Portions of this note were generated with [employer]. Dictation errors may occur despite best attempts at proofreading.    Final Clinical Impression(s) / ED Diagnoses Final diagnoses:  Atypical chest pain    Rx / DC Orders ED Discharge Orders          Ordered    omeprazole (PRILOSEC) 20 MG capsule  Daily        02/25/22 0029              Adolphus Birchwood, PA-C 02/25/22 0039    Palumbo, April, MD 02/25/22 0149

## 2022-02-24 NOTE — ED Triage Notes (Signed)
Patient coming to ED for evaluation chest pain.  Reports "my heart has been burning since this morning."  C/o generalized chest pain and back pain.  Hx of DM, HTN, and hyperlipidemia.  States pain increased with PO intake.

## 2022-02-25 LAB — TROPONIN I (HIGH SENSITIVITY): Troponin I (High Sensitivity): 3 ng/L (ref <18)

## 2022-02-25 MED ORDER — OMEPRAZOLE 20 MG PO CPDR: 20.0000 mg | DELAYED_RELEASE_CAPSULE | Freq: Every day | ORAL | 0 refills | Status: AC

## 2022-02-25 NOTE — Discharge Instructions (Addendum)
You were seen in the ED today for chest pain. Your workup today was very reassuring. I have started you on Omeprazole which is a medicine for acid reflux. Please start taking this once daily. Please schedule a follow up visit with your PCP when you get back to [place].  If you develop any shortness of breath or other worsening symptoms, please return to the ED.
